# Patient Record
Sex: Female | Born: 1935 | Race: White | Hispanic: No | Marital: Married | Smoking: Never smoker
Health system: Southern US, Community
[De-identification: ages and names within clinical notes are randomized; demographics above are authoritative.]

## PROBLEM LIST (undated history)

## (undated) DIAGNOSIS — E079 Disorder of thyroid, unspecified: Secondary | ICD-10-CM

## (undated) DIAGNOSIS — K5792 Diverticulitis of intestine, part unspecified, without perforation or abscess without bleeding: Secondary | ICD-10-CM

## (undated) DIAGNOSIS — E785 Hyperlipidemia, unspecified: Secondary | ICD-10-CM

## (undated) HISTORY — DX: Disorder of thyroid, unspecified: E07.9

## (undated) HISTORY — DX: Hyperlipidemia, unspecified: E78.5

---

## 2001-07-23 ENCOUNTER — Other Ambulatory Visit: Admission: RE | Admit: 2001-07-23 | Discharge: 2001-07-23 | Payer: Self-pay | Admitting: Family Medicine

## 2003-07-20 ENCOUNTER — Ambulatory Visit (HOSPITAL_COMMUNITY): Admission: RE | Admit: 2003-07-20 | Discharge: 2003-07-20 | Payer: Self-pay | Admitting: *Deleted

## 2014-12-10 ENCOUNTER — Other Ambulatory Visit: Payer: Self-pay | Admitting: *Deleted

## 2014-12-10 DIAGNOSIS — H9190 Unspecified hearing loss, unspecified ear: Secondary | ICD-10-CM

## 2014-12-10 NOTE — Progress Notes (Signed)
Pt was discussing this with [facility] - when her husband was seen in office

## 2017-02-28 ENCOUNTER — Ambulatory Visit: Payer: Self-pay | Admitting: Pediatrics

## 2019-05-28 ENCOUNTER — Other Ambulatory Visit: Payer: Self-pay

## 2019-05-29 ENCOUNTER — Ambulatory Visit (INDEPENDENT_AMBULATORY_CARE_PROVIDER_SITE_OTHER): Payer: Medicare Other | Admitting: Family Medicine

## 2019-05-29 ENCOUNTER — Encounter: Payer: Self-pay | Admitting: Family Medicine

## 2019-05-29 VITALS — BP 141/82 | HR 84 | Temp 98.4°F | Ht 67.0 in | Wt 160.6 lb

## 2019-05-29 DIAGNOSIS — E039 Hypothyroidism, unspecified: Secondary | ICD-10-CM | POA: Insufficient documentation

## 2019-05-29 DIAGNOSIS — B360 Pityriasis versicolor: Secondary | ICD-10-CM

## 2019-05-29 DIAGNOSIS — E663 Overweight: Secondary | ICD-10-CM

## 2019-05-29 DIAGNOSIS — R7989 Other specified abnormal findings of blood chemistry: Secondary | ICD-10-CM

## 2019-05-29 NOTE — Progress Notes (Signed)
BP (!) 141/82   Pulse 84   Temp 98.4 F (36.9 C) (Temporal)   Ht '5\' 7"'  (1.702 m)   Wt 160 lb 9.6 oz (72.8 kg)   BMI 25.15 kg/m    Subjective:    Patient ID: Tiffany [name], female    DOB: 27-Jul-1934, 85 y.o.   MRN: [id number]  HPI: Tiffany [name] is a 85 y.o. female presenting on 05/29/2019 for New Patient (Initial Visit) (check up - Patient states she has not been to the doctor in years.), Establish Care, and Back Pain (Patient states it has been on and off.)   HPI Hypothyroidism recheck Patient is coming in for thyroid recheck today as well. They deny any issues with hair changes or heat or cold problems or diarrhea or constipation. They deny any chest pain or palpitations. They are currently on no medication for the thyroid but we are monitoring and the levels have been up and down  Rash Patient comes in complaining of a rash that is on the shoulders and arms and is was pigment loss.  She denies any redness or itchiness.  She says it is been there over the past few weeks and cannot tell if it spreading or not  Relevant past medical, surgical, family and social history reviewed and updated as indicated. Interim medical history since our last visit reviewed. Allergies and medications reviewed and updated.  Review of Systems  Constitutional: Negative for chills and fever.  Eyes: Negative for visual disturbance.  Respiratory: Negative for chest tightness and shortness of breath.   Cardiovascular: Negative for chest pain and leg swelling.  Musculoskeletal: Negative for back pain and gait problem.  Skin: Positive for color change and rash.  Neurological: Negative for light-headedness and headaches.  Psychiatric/Behavioral: Negative for agitation and behavioral problems.  All other systems reviewed and are negative.   Per HPI unless specifically indicated above  Social History   Socioeconomic History  . Marital status: Single    Spouse name: Not on file  . Number of  children: Not on file  . Years of education: Not on file  . Highest education level: Not on file  Occupational History  . Not on file  Social Needs  . Financial resource strain: Not on file  . Food insecurity    Worry: Not on file    Inability: Not on file  . Transportation needs    Medical: Not on file    Non-medical: Not on file  Tobacco Use  . Smoking status: Never Smoker  . Smokeless tobacco: Never Used  Substance and Sexual Activity  . Alcohol use: Never    Frequency: Never  . Drug use: Never  . Sexual activity: Not on file    Comment: married since 1975, stepchildren  Lifestyle  . Physical activity    Days per week: Not on file    Minutes per session: Not on file  . Stress: Not on file  Relationships  . Social Herbalist on phone: Not on file    Gets together: Not on file    Attends religious service: Not on file    Active member of club or organization: Not on file    Attends meetings of clubs or organizations: Not on file    Relationship status: Not on file  . Intimate partner violence    Fear of current or ex partner: Not on file    Emotionally abused: Not on file    Physically abused:  Not on file    Forced sexual activity: Not on file  Other Topics Concern  . Not on file  Social History Narrative  . Not on file    History reviewed. No pertinent surgical history.  Family History  Problem Relation Age of Onset  . Diabetes Mother   . Stroke Mother     Allergies as of 05/29/2019      Reactions   Penicillins Hives   Sulfa Antibiotics    Tetanus Toxoids Hives      Medication List       Accurate as of May 29, 2019  1:51 PM. If you have any questions, ask your nurse or doctor.        latanoprost 0.005 % ophthalmic solution Commonly known as: XALATAN 1 drop at bedtime.   OVER THE COUNTER MEDICATION Thyroid support          Objective:    BP (!) 141/82   Pulse 84   Temp 98.4 F (36.9 C) (Temporal)   Ht '5\' 7"'  (1.702 m)   Wt  160 lb 9.6 oz (72.8 kg)   BMI 25.15 kg/m   Wt Readings from Last 3 Encounters:  05/29/19 160 lb 9.6 oz (72.8 kg)    Physical Exam Vitals signs and nursing note reviewed.  Constitutional:      General: She is not in acute distress.    Appearance: She is well-developed. She is not diaphoretic.  Eyes:     Conjunctiva/sclera: Conjunctivae normal.  Cardiovascular:     Rate and Rhythm: Normal rate and regular rhythm.     Heart sounds: Normal heart sounds. No murmur.  Pulmonary:     Effort: Pulmonary effort is normal. No respiratory distress.     Breath sounds: Normal breath sounds. No wheezing.  Skin:    General: Skin is warm and dry.     Findings: Rash (Hypopigmentation and splotches on both arms and upper torso and some on her hairline) present.  Neurological:     Mental Status: She is alert and oriented to person, place, and time.     Coordination: Coordination normal.  Psychiatric:        Behavior: Behavior normal.     No results found for this or any previous visit.    Assessment & Plan:   Problem List Items Addressed This Visit      Endocrine   Hypothyroidism   Relevant Orders   CMP14+EGFR (Completed)   TSH (Completed)     Other   Overweight (BMI 25.0-29.9)   Relevant Orders   CBC with Differential/Platelet (Completed)   CMP14+EGFR (Completed)   Lipid panel (Completed)    Other Visit Diagnoses    Tinea versicolor    -  Primary   Relevant Medications   ketoconazole (NIZORAL) 2 % cream   Elevated TSH          Rash is likely tinea versicolor to we will treat for that but the possibility could be another type of hypopigmentation Follow up plan: Return in about 1 year (around 05/28/2020), or if symptoms worsen or fail to improve.  Caryl Pina, MD [facility] 05/29/2019, 1:51 PM

## 2019-05-30 ENCOUNTER — Telehealth: Payer: Self-pay | Admitting: Family Medicine

## 2019-05-30 LAB — CMP14+EGFR
ALT: 12 IU/L (ref 0–32)
AST: 15 IU/L (ref 0–40)
Albumin/Globulin Ratio: 2.3 — ABNORMAL HIGH (ref 1.2–2.2)
Albumin: 4.6 g/dL (ref 3.6–4.6)
Alkaline Phosphatase: 67 IU/L (ref 39–117)
BUN/Creatinine Ratio: 19 (ref 12–28)
BUN: 15 mg/dL (ref 8–27)
Bilirubin Total: 0.5 mg/dL (ref 0.0–1.2)
CO2: 26 mmol/L (ref 20–29)
Calcium: 10 mg/dL (ref 8.7–10.3)
Chloride: 101 mmol/L (ref 96–106)
Creatinine, Ser: 0.79 mg/dL (ref 0.57–1.00)
GFR calc Af Amer: 80 mL/min/{1.73_m2} (ref >59)
GFR calc non Af Amer: 69 mL/min/{1.73_m2} (ref >59)
Globulin, Total: 2 g/dL (ref 1.5–4.5)
Glucose: 99 mg/dL (ref 65–99)
Potassium: 4.3 mmol/L (ref 3.5–5.2)
Sodium: 143 mmol/L (ref 134–144)
Total Protein: 6.6 g/dL (ref 6.0–8.5)

## 2019-05-30 LAB — CBC WITH DIFFERENTIAL/PLATELET
Basophils Absolute: 0 10*3/uL (ref 0.0–0.2)
Basos: 1 %
EOS (ABSOLUTE): 0 10*3/uL (ref 0.0–0.4)
Eos: 1 %
Hematocrit: 41.7 % (ref 34.0–46.6)
Hemoglobin: 14.7 g/dL (ref 11.1–15.9)
Immature Grans (Abs): 0 10*3/uL (ref 0.0–0.1)
Immature Granulocytes: 0 %
Lymphocytes Absolute: 1.9 10*3/uL (ref 0.7–3.1)
Lymphs: 27 %
MCH: 31.8 pg (ref 26.6–33.0)
MCHC: 35.3 g/dL (ref 31.5–35.7)
MCV: 90 fL (ref 79–97)
Monocytes Absolute: 0.6 10*3/uL (ref 0.1–0.9)
Monocytes: 8 %
Neutrophils Absolute: 4.5 10*3/uL (ref 1.4–7.0)
Neutrophils: 63 %
Platelets: 348 10*3/uL (ref 150–450)
RBC: 4.62 x10E6/uL (ref 3.77–5.28)
RDW: 13.1 % (ref 11.7–15.4)
WBC: 7 10*3/uL (ref 3.4–10.8)

## 2019-05-30 LAB — TSH: TSH: 7.07 u[IU]/mL — ABNORMAL HIGH (ref 0.450–4.500)

## 2019-05-30 LAB — LIPID PANEL
Chol/HDL Ratio: 4.6 ratio — ABNORMAL HIGH (ref 0.0–4.4)
Cholesterol, Total: 259 mg/dL — ABNORMAL HIGH (ref 100–199)
HDL: 56 mg/dL (ref >39)
LDL Calculated: 181 mg/dL — ABNORMAL HIGH (ref 0–99)
Triglycerides: 109 mg/dL (ref 0–149)
VLDL Cholesterol Cal: 22 mg/dL (ref 5–40)

## 2019-05-30 MED ORDER — KETOCONAZOLE 2 % EX CREA: 1.0000 "application " | TOPICAL_CREAM | Freq: Every day | CUTANEOUS | 2 refills | Status: DC

## 2019-05-30 NOTE — Telephone Encounter (Signed)
Husband aware per dpr.  

## 2019-05-30 NOTE — Telephone Encounter (Signed)
Please let the patient know that I forgot to send him I sent in the cream today, have her use it every day and that will take at least a few weeks to get healed.

## 2019-06-02 MED ORDER — PRAVASTATIN SODIUM 20 MG PO TABS: 20.0000 mg | ORAL_TABLET | Freq: Every day | ORAL | 1 refills | Status: DC

## 2019-07-24 ENCOUNTER — Other Ambulatory Visit: Payer: Self-pay | Admitting: *Deleted

## 2019-07-24 DIAGNOSIS — Z20822 Contact with and (suspected) exposure to covid-19: Secondary | ICD-10-CM

## 2019-07-25 LAB — NOVEL CORONAVIRUS, NAA: SARS-CoV-2, NAA: NOT DETECTED

## 2019-09-24 ENCOUNTER — Encounter: Payer: Self-pay | Admitting: Family Medicine

## 2019-09-24 ENCOUNTER — Other Ambulatory Visit: Payer: Self-pay

## 2019-09-24 ENCOUNTER — Ambulatory Visit (INDEPENDENT_AMBULATORY_CARE_PROVIDER_SITE_OTHER): Payer: Medicare Other | Admitting: Family Medicine

## 2019-09-24 VITALS — BP 136/73 | HR 90 | Temp 97.1°F | Resp 20 | Ht 67.0 in | Wt 146.0 lb

## 2019-09-24 DIAGNOSIS — K5732 Diverticulitis of large intestine without perforation or abscess without bleeding: Secondary | ICD-10-CM | POA: Diagnosis not present

## 2019-09-24 MED ORDER — METRONIDAZOLE 500 MG PO TABS: 500.0000 mg | ORAL_TABLET | Freq: Two times a day (BID) | ORAL | 0 refills | Status: DC

## 2019-09-24 MED ORDER — CIPROFLOXACIN HCL 500 MG PO TABS: 500.0000 mg | ORAL_TABLET | Freq: Two times a day (BID) | ORAL | 0 refills | Status: DC

## 2019-09-24 NOTE — Patient Instructions (Signed)
High-Fiber Diet Fiber, also called dietary fiber, is a type of carbohydrate that is found in fruits, vegetables, whole grains, and beans. A high-fiber diet can have many health benefits. Your health care provider may recommend a high-fiber diet to help:  Prevent constipation. Fiber can make your bowel movements more regular.  Lower your cholesterol.  Relieve the following conditions: ? Swelling of veins in the anus (hemorrhoids). ? Swelling and irritation (inflammation) of specific areas of the digestive tract (uncomplicated diverticulosis). ? A problem of the large intestine (colon) that sometimes causes pain and diarrhea (irritable bowel syndrome, IBS).  Prevent overeating as part of a weight-loss plan.  Prevent heart disease, type 2 diabetes, and certain cancers. What is my plan? The recommended daily fiber intake in grams (g) includes:  38 g for men age 50 or younger.  30 g for men over age 50.  25 g for women age 50 or younger.  21 g for women over age 50. You can get the recommended daily intake of dietary fiber by:  Eating a variety of fruits, vegetables, grains, and beans.  Taking a fiber supplement, if it is not possible to get enough fiber through your diet. What do I need to know about a high-fiber diet?  It is better to get fiber through food sources rather than from fiber supplements. There is not a lot of research about how effective supplements are.  Always check the fiber content on the nutrition facts label of any prepackaged food. Look for foods that contain 5 g of fiber or more per serving.  Talk with a diet and nutrition specialist (dietitian) if you have questions about specific foods that are recommended or not recommended for your medical condition, especially if those foods are not listed below.  Gradually increase how much fiber you consume. If you increase your intake of dietary fiber too quickly, you may have bloating, cramping, or gas.  Drink plenty  of water. Water helps you to digest fiber. What are tips for following this plan?  Eat a wide variety of high-fiber foods.  Make sure that half of the grains that you eat each day are whole grains.  Eat breads and cereals that are made with whole-grain flour instead of refined flour or white flour.  Eat brown rice, bulgur wheat, or millet instead of white rice.  Start the day with a breakfast that is high in fiber, such as a cereal that contains 5 g of fiber or more per serving.  Use beans in place of meat in soups, salads, and pasta dishes.  Eat high-fiber snacks, such as berries, raw vegetables, nuts, and popcorn.  Choose whole fruits and vegetables instead of processed forms like juice or sauce. What foods can I eat?  Fruits Berries. Pears. Apples. Oranges. Avocado. Prunes and raisins. Dried figs. Vegetables Sweet potatoes. Spinach. Kale. Artichokes. Cabbage. Broccoli. Cauliflower. Green peas. Carrots. Squash. Grains Whole-grain breads. Multigrain cereal. Oats and oatmeal. Brown rice. Barley. Bulgur wheat. Millet. Quinoa. Bran muffins. Popcorn. Rye wafer crackers. Meats and other proteins Navy, kidney, and pinto beans. Soybeans. Split peas. Lentils. Nuts and seeds. Dairy Fiber-fortified yogurt. Beverages Fiber-fortified soy milk. Fiber-fortified orange juice. Other foods Fiber bars. The items listed above may not be a complete list of recommended foods and beverages. Contact a dietitian for more options. What foods are not recommended? Fruits Fruit juice. Cooked, strained fruit. Vegetables Fried potatoes. Canned vegetables. Well-cooked vegetables. Grains White bread. Pasta made with refined flour. White rice. Meats and other   proteins Fatty cuts of meat. Fried chicken or fried fish. Dairy Milk. Yogurt. Cream cheese. Sour cream. Fats and oils Butters. Beverages Soft drinks. Other foods Cakes and pastries. The items listed above may not be a complete list of foods  and beverages to avoid. Contact a dietitian for more information. Summary  Fiber is a type of carbohydrate. It is found in fruits, vegetables, whole grains, and beans.  There are many health benefits of eating a high-fiber diet, such as preventing constipation, lowering blood cholesterol, helping with weight loss, and reducing your risk of heart disease, diabetes, and certain cancers.  Gradually increase your intake of fiber. Increasing too fast can result in cramping, bloating, and gas. Drink plenty of water while you increase your fiber.  The best sources of fiber include whole fruits and vegetables, whole grains, nuts, seeds, and beans. This information is not intended to replace advice given to you by your health care provider. Make sure you discuss any questions you have with your health care provider. Document Released: 10/23/2005 Document Revised: 08/27/2017 Document Reviewed: 08/27/2017 Elsevier Patient Education  2020 [employer]. Diverticulosis  Diverticulosis is a condition that develops when small pouches (diverticula) form in the wall of the large intestine (colon). The colon is where water is absorbed and stool is formed. The pouches form when the inside layer of the colon pushes through weak spots in the outer layers of the colon. You may have a few pouches or many of them. What are the causes? The cause of this condition is not known. What increases the risk? The following factors may make you more likely to develop this condition:  Being older than age 28. Your risk for this condition increases with age. Diverticulosis is rare among people younger than age 56. By age 12, many people have it.  Eating a low-fiber diet.  Having frequent constipation.  Being overweight.  Not getting enough exercise.  Smoking.  Taking over-the-counter pain medicines, like aspirin and ibuprofen.  Having a family history of diverticulosis. What are the signs or symptoms? In most  people, there are no symptoms of this condition. If you do have symptoms, they may include:  Bloating.  Cramps in the abdomen.  Constipation or diarrhea.  Pain in the lower left side of the abdomen. How is this diagnosed? This condition is most often diagnosed during an exam for other colon problems. Because diverticulosis usually has no symptoms, it often cannot be diagnosed independently. This condition may be diagnosed by:  Using a flexible scope to examine the colon (colonoscopy).  Taking an X-ray of the colon after dye has been put into the colon (barium enema).  Doing a CT scan. How is this treated? You may not need treatment for this condition if you have never developed an infection related to diverticulosis. If you have had an infection before, treatment may include:  Eating a high-fiber diet. This may include eating more fruits, vegetables, and grains.  Taking a fiber supplement.  Taking a live bacteria supplement (probiotic).  Taking medicine to relax your colon.  Taking antibiotic medicines. Follow these instructions at home:  Drink 6-8 glasses of water or more each day to prevent constipation.  Try not to strain when you have a bowel movement.  If you have had an infection before: ? Eat more fiber as directed by your health care provider or your diet and nutrition specialist (dietitian). ? Take a fiber supplement or probiotic, if your health care provider approves.  Take over-the-counter  and prescription medicines only as told by your health care provider.  If you were prescribed an antibiotic, take it as told by your health care provider. Do not stop taking the antibiotic even if you start to feel better.  Keep all follow-up visits as told by your health care provider. This is important. Contact a health care provider if:  You have pain in your abdomen.  You have bloating.  You have cramps.  You have not had a bowel movement in 3 days. Get help right  away if:  Your pain gets worse.  Your bloating becomes very bad.  You have a fever or chills, and your symptoms suddenly get worse.  You vomit.  You have bowel movements that are bloody or black.  You have bleeding from your rectum. Summary  Diverticulosis is a condition that develops when small pouches (diverticula) form in the wall of the large intestine (colon).  You may have a few pouches or many of them.  This condition is most often diagnosed during an exam for other colon problems.  If you have had an infection related to diverticulosis, treatment may include increasing the fiber in your diet, taking supplements, or taking medicines. This information is not intended to replace advice given to you by your health care provider. Make sure you discuss any questions you have with your health care provider. Document Released: 07/20/2004 Document Revised: 10/05/2017 Document Reviewed: 09/11/2016 Elsevier Patient Education  2020 [employer].

## 2019-09-24 NOTE — Progress Notes (Signed)
Subjective:  Patient ID: Tiffany [name], female    DOB: September 05, 1934  Age: 85 y.o. MRN: [id number]  CC: Abdominal Pain (? hernia or diverticulitis)   HPI Tiffany [name] presents for bloating and pain in abd after eating tomatoes with seeds recently. No fever, no diarrhea. Moderate nausea. Has no blood in stool. HAs a history of diverticulitis. Appetite is normal .Concerned about hernia due to tightness at LUQ.Onset 2-3 days ago. Getting worse.   Depression screen [facility] 2/9 09/24/2019 05/29/2019  Decreased Interest 0 0  Down, Depressed, Hopeless 0 1  PHQ - 2 Score 0 1    History Tahesha has a past medical history of Hyperlipidemia and Thyroid disease.   She has no past surgical history on file.   Her family history includes Diabetes in her mother; Stroke in her mother.She reports that she has never smoked. She has never used smokeless tobacco. She reports that she does not drink alcohol or use drugs.    ROS Review of Systems  Constitutional: Negative.   HENT: Negative.   Eyes: Negative for visual disturbance.  Respiratory: Negative for shortness of breath.   Cardiovascular: Negative for chest pain.  Gastrointestinal: Positive for abdominal pain and nausea. Negative for blood in stool, constipation and diarrhea.  Musculoskeletal: Negative for arthralgias.    Objective:  BP 136/73   Pulse 90   Temp (!) 97.1 F (36.2 C) (Temporal)   Resp 20   Ht 5\' 7"  (1.702 m)   Wt 146 lb (66.2 kg)   SpO2 100%   BMI 22.87 kg/m   BP Readings from Last 3 Encounters:  09/24/19 136/73  05/29/19 (!) 141/82    Wt Readings from Last 3 Encounters:  09/24/19 146 lb (66.2 kg)  05/29/19 160 lb 9.6 oz (72.8 kg)     Physical Exam    Assessment & Plan:   Linzee was seen today for abdominal pain.  Diagnoses and all orders for this visit:  Diverticulitis of large intestine without bleeding, unspecified complication status -     CBC -     CMP -     Lipase  Other orders -      ciprofloxacin (CIPRO) 500 MG tablet; Take 1 tablet (500 mg total) by mouth 2 (two) times daily. -     metroNIDAZOLE (FLAGYL) 500 MG tablet; Take 1 tablet (500 mg total) by mouth 2 (two) times daily.       I have discontinued Amadi G. Greenwell's pravastatin. I am also having her start on ciprofloxacin and metroNIDAZOLE. Additionally, I am having her maintain her OVER THE COUNTER MEDICATION, latanoprost, and ketoconazole.  Allergies as of 09/24/2019      Reactions   Penicillins Hives   Sulfa Antibiotics    Tetanus Toxoids Hives      Medication List       Accurate as of September 24, 2019  4:20 PM. If you have any questions, ask your nurse or doctor.        STOP taking these medications   pravastatin 20 MG tablet Commonly known as: PRAVACHOL Stopped by: Claretta Fraise, MD     TAKE these medications   ciprofloxacin 500 MG tablet Commonly known as: Cipro Take 1 tablet (500 mg total) by mouth 2 (two) times daily. Started by: Claretta Fraise, MD   ketoconazole 2 % cream Commonly known as: NIZORAL Apply 1 application topically daily.   latanoprost 0.005 % ophthalmic solution Commonly known as: XALATAN 1 drop at bedtime.   metroNIDAZOLE  500 MG tablet Commonly known as: FLAGYL Take 1 tablet (500 mg total) by mouth 2 (two) times daily. Started by: Mechele Claude, MD   OVER THE COUNTER MEDICATION Thyroid support        Follow-up: Return if symptoms worsen or fail to improve.  Mechele Claude, M.D.

## 2019-09-25 LAB — CMP14+EGFR
ALT: 18 IU/L (ref 0–32)
AST: 18 IU/L (ref 0–40)
Albumin/Globulin Ratio: 1.8 (ref 1.2–2.2)
Albumin: 4.4 g/dL (ref 3.6–4.6)
Alkaline Phosphatase: 62 IU/L (ref 39–117)
BUN/Creatinine Ratio: 15 (ref 12–28)
BUN: 11 mg/dL (ref 8–27)
Bilirubin Total: 0.5 mg/dL (ref 0.0–1.2)
CO2: 25 mmol/L (ref 20–29)
Calcium: 10.1 mg/dL (ref 8.7–10.3)
Chloride: 100 mmol/L (ref 96–106)
Creatinine, Ser: 0.73 mg/dL (ref 0.57–1.00)
GFR calc Af Amer: 88 mL/min/{1.73_m2} (ref >59)
GFR calc non Af Amer: 76 mL/min/{1.73_m2} (ref >59)
Globulin, Total: 2.4 g/dL (ref 1.5–4.5)
Glucose: 109 mg/dL — ABNORMAL HIGH (ref 65–99)
Potassium: 4.2 mmol/L (ref 3.5–5.2)
Sodium: 140 mmol/L (ref 134–144)
Total Protein: 6.8 g/dL (ref 6.0–8.5)

## 2019-09-25 LAB — CBC WITH DIFFERENTIAL/PLATELET
Basophils Absolute: 0.1 10*3/uL (ref 0.0–0.2)
Basos: 1 %
EOS (ABSOLUTE): 0 10*3/uL (ref 0.0–0.4)
Eos: 0 %
Hematocrit: 41.6 % (ref 34.0–46.6)
Hemoglobin: 14 g/dL (ref 11.1–15.9)
Immature Grans (Abs): 0 10*3/uL (ref 0.0–0.1)
Immature Granulocytes: 0 %
Lymphocytes Absolute: 2.3 10*3/uL (ref 0.7–3.1)
Lymphs: 28 %
MCH: 30.5 pg (ref 26.6–33.0)
MCHC: 33.7 g/dL (ref 31.5–35.7)
MCV: 91 fL (ref 79–97)
Monocytes Absolute: 0.7 10*3/uL (ref 0.1–0.9)
Monocytes: 9 %
Neutrophils Absolute: 4.9 10*3/uL (ref 1.4–7.0)
Neutrophils: 62 %
Platelets: 401 10*3/uL (ref 150–450)
RBC: 4.59 x10E6/uL (ref 3.77–5.28)
RDW: 13.7 % (ref 11.7–15.4)
WBC: 8 10*3/uL (ref 3.4–10.8)

## 2019-09-25 LAB — LIPASE: Lipase: 49 U/L (ref 14–85)

## 2019-09-25 NOTE — Progress Notes (Signed)
Hello Lorenia,  Your lab result is normal and/or stable.Some minor variations that are not significant are commonly marked abnormal, but do not represent any medical problem for you.  Best regards, Claretta Fraise, M.D.

## 2019-09-26 ENCOUNTER — Telehealth: Payer: Self-pay | Admitting: Family Medicine

## 2019-09-26 NOTE — Telephone Encounter (Signed)
Patients husband called and said they were having phone issues yesterday so is unsure if someone called them for patients lab results. Wants someone to call with results when available @ cell # [phone]

## 2019-09-26 NOTE — Telephone Encounter (Signed)
Husband called and aware of labs

## 2019-09-29 ENCOUNTER — Telehealth: Payer: Self-pay | Admitting: Family Medicine

## 2019-09-29 NOTE — Telephone Encounter (Signed)
Patients husband states she has no energy has no get up and go. Patient has has extreme weakness she can not stand up. Was advised to go straight to the ER. Patient states it just hit her this morning. FYI

## 2019-09-29 NOTE — Telephone Encounter (Signed)
I agree with her going to the emergency department

## 2019-09-30 ENCOUNTER — Telehealth: Payer: Self-pay | Admitting: Family Medicine

## 2019-09-30 NOTE — Telephone Encounter (Signed)
What happens when she takes Sulf? We could stop cipro and try Bactrim. Unsure what type of allergy she has with the Bactrim (sulfa).

## 2019-09-30 NOTE — Telephone Encounter (Signed)
Left message to contact office about what type of reaction she has to sulfa

## 2019-09-30 NOTE — Telephone Encounter (Signed)
Husband aware and verbalizes understanding per dpr. °

## 2019-10-01 ENCOUNTER — Emergency Department (HOSPITAL_COMMUNITY)
Admission: EM | Admit: 2019-10-01 | Discharge: 2019-10-01 | Disposition: A | Discharge status: Home / Self Care | Payer: Medicare Other | Attending: Emergency Medicine | Admitting: Emergency Medicine

## 2019-10-01 ENCOUNTER — Emergency Department (HOSPITAL_COMMUNITY): Payer: Medicare Other

## 2019-10-01 ENCOUNTER — Encounter (HOSPITAL_COMMUNITY): Payer: Self-pay | Admitting: Emergency Medicine

## 2019-10-01 ENCOUNTER — Other Ambulatory Visit: Payer: Self-pay

## 2019-10-01 DIAGNOSIS — E039 Hypothyroidism, unspecified: Secondary | ICD-10-CM | POA: Insufficient documentation

## 2019-10-01 DIAGNOSIS — Z79899 Other long term (current) drug therapy: Secondary | ICD-10-CM | POA: Insufficient documentation

## 2019-10-01 DIAGNOSIS — R531 Weakness: Secondary | ICD-10-CM | POA: Diagnosis present

## 2019-10-01 LAB — URINALYSIS, ROUTINE W REFLEX MICROSCOPIC
Bilirubin Urine: NEGATIVE
Glucose, UA: NEGATIVE mg/dL
Hgb urine dipstick: NEGATIVE
Ketones, ur: NEGATIVE mg/dL
Leukocytes,Ua: NEGATIVE
Nitrite: NEGATIVE
Protein, ur: NEGATIVE mg/dL
Specific Gravity, Urine: 1.002 — ABNORMAL LOW (ref 1.005–1.030)
pH: 6 (ref 5.0–8.0)

## 2019-10-01 LAB — CBC WITH DIFFERENTIAL/PLATELET
Abs Immature Granulocytes: 0.02 10*3/uL (ref 0.00–0.07)
Basophils Absolute: 0.1 10*3/uL (ref 0.0–0.1)
Basophils Relative: 1 %
Eosinophils Absolute: 0.1 10*3/uL (ref 0.0–0.5)
Eosinophils Relative: 1 %
HCT: 39.1 % (ref 36.0–46.0)
Hemoglobin: 13 g/dL (ref 12.0–15.0)
Immature Granulocytes: 0 %
Lymphocytes Relative: 16 %
Lymphs Abs: 1.4 10*3/uL (ref 0.7–4.0)
MCH: 31 pg (ref 26.0–34.0)
MCHC: 33.2 g/dL (ref 30.0–36.0)
MCV: 93.3 fL (ref 80.0–100.0)
Monocytes Absolute: 0.8 10*3/uL (ref 0.1–1.0)
Monocytes Relative: 9 %
Neutro Abs: 6.3 10*3/uL (ref 1.7–7.7)
Neutrophils Relative %: 73 %
Platelets: 353 10*3/uL (ref 150–400)
RBC: 4.19 MIL/uL (ref 3.87–5.11)
RDW: 14.5 % (ref 11.5–15.5)
WBC: 8.7 10*3/uL (ref 4.0–10.5)
nRBC: 0 % (ref 0.0–0.2)

## 2019-10-01 LAB — BASIC METABOLIC PANEL
Anion gap: 10 (ref 5–15)
BUN: 13 mg/dL (ref 8–23)
CO2: 24 mmol/L (ref 22–32)
Calcium: 9.1 mg/dL (ref 8.9–10.3)
Chloride: 95 mmol/L — ABNORMAL LOW (ref 98–111)
Creatinine, Ser: 0.55 mg/dL (ref 0.44–1.00)
GFR calc Af Amer: >60 mL/min (ref >60)
GFR calc non Af Amer: >60 mL/min (ref >60)
Glucose, Bld: 101 mg/dL — ABNORMAL HIGH (ref 70–99)
Potassium: 4.1 mmol/L (ref 3.5–5.1)
Sodium: 129 mmol/L — ABNORMAL LOW (ref 135–145)

## 2019-10-01 LAB — TSH: TSH: 4.04 u[IU]/mL (ref 0.350–4.500)

## 2019-10-01 MED ORDER — SODIUM CHLORIDE 0.9 % IV BOLUS
500.0000 mL | Freq: Once | INTRAVENOUS | Status: AC
  Administered 2019-10-01: 500 mL via INTRAVENOUS

## 2019-10-01 NOTE — ED Triage Notes (Signed)
Pt was placed on cipro and flagyl for diverticulitis on 10/18. Pt states cipro made her weak in her legs, called PCP and was taken off of it.  Pt was also told to come to ER for additional blood work.

## 2019-10-01 NOTE — ED Provider Notes (Signed)
[facility] EMERGENCY DEPARTMENT Provider Note   CSN: [id number] Arrival date & time: 10/01/19  1245     History   Chief Complaint Chief Complaint  Patient presents with  . Weakness    HPI Tiffany [name] is a 85 y.o. female.     Patient is an 85 year old female with past medical history of hyperlipidemia, hypothyroidism, and recent diagnosis of diverticulitis.  She just completed a course of Cipro and Flagyl.  Her abdominal pain resolved, however she is now feeling somewhat weak and fatigued.  She was told by her primary doctor to come here for blood work to have this weakness evaluated.  Patient states that she feels "weak in the legs".  She denies chest pain or difficulty breathing.  She denies abdominal pain, nausea, vomiting, or diarrhea.  She denies any black or bloody stools.  The history is provided by the patient.  Weakness Severity:  Moderate Onset quality:  Gradual Duration:  2 days Timing:  Constant Progression:  Worsening Chronicity:  New Relieved by:  Nothing Worsened by:  Nothing Ineffective treatments:  None tried   Past Medical History:  Diagnosis Date  . Hyperlipidemia   . Thyroid disease     Patient Active Problem List   Diagnosis Date Noted  . Hypothyroidism 05/29/2019  . Overweight (BMI 25.0-29.9) 05/29/2019    History reviewed. No pertinent surgical history.   OB History   No obstetric history on file.      Home Medications    Prior to Admission medications   Medication Sig Start Date End Date Taking? Authorizing Provider  ciprofloxacin (CIPRO) 500 MG tablet Take 1 tablet (500 mg total) by mouth 2 (two) times daily. 09/24/19   Claretta Fraise, MD  ketoconazole (NIZORAL) 2 % cream Apply 1 application topically daily. 05/30/19   Dettinger, Fransisca Kaufmann, MD  latanoprost (XALATAN) 0.005 % ophthalmic solution 1 drop at bedtime.    [provider]  metroNIDAZOLE (FLAGYL) 500 MG tablet Take 1 tablet (500 mg total) by mouth 2 (two) times  daily. 09/24/19   Claretta Fraise, MD  OVER THE COUNTER MEDICATION Thyroid support    [provider]    Family History Family History  Problem Relation Age of Onset  . Diabetes Mother   . Stroke Mother     Social History Social History   Tobacco Use  . Smoking status: Never Smoker  . Smokeless tobacco: Never Used  Substance Use Topics  . Alcohol use: Never    Frequency: Never  . Drug use: Never     Allergies   Penicillins, Sulfa antibiotics, and Tetanus toxoids   Review of Systems Review of Systems  Neurological: Positive for weakness.  All other systems reviewed and are negative.    Physical Exam Updated Vital Signs BP (!) 160/67   Pulse 93   Temp 98.4 F (36.9 C)   Resp (!) 25   Wt 66.2 kg   SpO2 98%   BMI 22.87 kg/m   Physical Exam Vitals signs and nursing note reviewed.  Constitutional:      General: She is not in acute distress.    Appearance: She is well-developed. She is not diaphoretic.  HENT:     Head: Normocephalic and atraumatic.     Mouth/Throat:     Mouth: Mucous membranes are moist.     Pharynx: No oropharyngeal exudate or posterior oropharyngeal erythema.  Neck:     Musculoskeletal: Normal range of motion and neck supple.  Cardiovascular:  Rate and Rhythm: Normal rate and regular rhythm.     Heart sounds: No murmur. No friction rub. No gallop.   Pulmonary:     Effort: Pulmonary effort is normal. No respiratory distress.     Breath sounds: Normal breath sounds. No wheezing.  Abdominal:     General: Bowel sounds are normal. There is no distension.     Palpations: Abdomen is soft.     Tenderness: There is no abdominal tenderness.  Musculoskeletal: Normal range of motion.  Skin:    General: Skin is warm and dry.  Neurological:     General: No focal deficit present.     Mental Status: She is alert and oriented to person, place, and time.     Cranial Nerves: No cranial nerve deficit.     Sensory: No sensory deficit.      Motor: No weakness.     Coordination: Coordination normal.      ED Treatments / Results  Labs (all labs ordered are listed, but only abnormal results are displayed) Labs Reviewed  BASIC METABOLIC PANEL  CBC WITH DIFFERENTIAL/PLATELET  URINALYSIS, ROUTINE W REFLEX MICROSCOPIC  TSH    EKG EKG Interpretation  Date/Time:  Wednesday October 01 2019 13:00:59 EST Ventricular Rate:  99 PR Interval:    QRS Duration: 100 QT Interval:  337 QTC Calculation: 433 R Axis:   55 Text Interpretation: Sinus rhythm Low voltage, precordial leads Anteroseptal infarct, old Baseline wander in lead(s) V1 Confirmed by Geoffery Lyons ([id number]) on 10/01/2019 2:29:11 PM   Radiology No results found.  Procedures Procedures (including critical care time)  Medications Ordered in ED Medications  sodium chloride 0.9 % bolus 500 mL (has no administration in time range)     Initial Impression / Assessment and Plan / ED Course  I have reviewed the triage vital signs and the nursing notes.  Pertinent labs & imaging results that were available during my care of the patient were reviewed by me and considered in my medical decision making (see chart for details).  Patient presenting here with complaints of weakness.  She was recently treated for diverticulitis with Cipro and Flagyl.  Patient's work-up today reveals a normal CBC and basic metabolic panel.  EKG is unremarkable and chest x-ray shows no acute abnormality.  I am uncertain as to the etiology of the patient's weakness, however nothing appears emergent.  I feel as though the patient can safely be discharged with as needed return.  Final Clinical Impressions(s) / ED Diagnoses   Final diagnoses:  None    ED Discharge Orders    None       Geoffery Lyons, MD 10/01/19 1517

## 2019-10-01 NOTE — Discharge Instructions (Addendum)
Continue medications as previously prescribed.  Follow-up with your primary doctor in the next few days, and return to the ER in the meantime if symptoms significantly worsen or change.

## 2019-10-01 NOTE — ED Notes (Signed)
Patient with no complaints at this time. Respirations even and unlabored. Skin warm/dry. Discharge instructions reviewed with patient at this time. Patient given opportunity to voice concerns/ask questions. IV removed per policy and band-aid applied to site. Patient discharged at this time and left Emergency Department via wheelchair.  

## 2019-10-05 ENCOUNTER — Observation Stay (HOSPITAL_COMMUNITY)
Admission: EM | Admit: 2019-10-05 | Discharge: 2019-10-06 | Disposition: A | Discharge status: Home / Self Care | Payer: Medicare Other | Attending: Internal Medicine | Admitting: Internal Medicine

## 2019-10-05 ENCOUNTER — Encounter (HOSPITAL_COMMUNITY): Payer: Self-pay | Admitting: Emergency Medicine

## 2019-10-05 ENCOUNTER — Other Ambulatory Visit: Payer: Self-pay

## 2019-10-05 DIAGNOSIS — E860 Dehydration: Principal | ICD-10-CM | POA: Insufficient documentation

## 2019-10-05 DIAGNOSIS — R531 Weakness: Secondary | ICD-10-CM

## 2019-10-05 DIAGNOSIS — H409 Unspecified glaucoma: Secondary | ICD-10-CM

## 2019-10-05 DIAGNOSIS — Z79899 Other long term (current) drug therapy: Secondary | ICD-10-CM | POA: Diagnosis not present

## 2019-10-05 DIAGNOSIS — Z20828 Contact with and (suspected) exposure to other viral communicable diseases: Secondary | ICD-10-CM | POA: Diagnosis not present

## 2019-10-05 DIAGNOSIS — E871 Hypo-osmolality and hyponatremia: Secondary | ICD-10-CM | POA: Insufficient documentation

## 2019-10-05 DIAGNOSIS — E039 Hypothyroidism, unspecified: Secondary | ICD-10-CM | POA: Diagnosis not present

## 2019-10-05 DIAGNOSIS — K5792 Diverticulitis of intestine, part unspecified, without perforation or abscess without bleeding: Secondary | ICD-10-CM | POA: Insufficient documentation

## 2019-10-05 HISTORY — DX: Diverticulitis of intestine, part unspecified, without perforation or abscess without bleeding: K57.92

## 2019-10-05 LAB — URINALYSIS, ROUTINE W REFLEX MICROSCOPIC
Bilirubin Urine: NEGATIVE
Glucose, UA: NEGATIVE mg/dL
Hgb urine dipstick: NEGATIVE
Ketones, ur: NEGATIVE mg/dL
Leukocytes,Ua: NEGATIVE
Nitrite: NEGATIVE
Protein, ur: NEGATIVE mg/dL
Specific Gravity, Urine: 1.001 — ABNORMAL LOW (ref 1.005–1.030)
pH: 5 (ref 5.0–8.0)

## 2019-10-05 LAB — BASIC METABOLIC PANEL
Anion gap: 10 (ref 5–15)
BUN: 12 mg/dL (ref 8–23)
CO2: 20 mmol/L — ABNORMAL LOW (ref 22–32)
Calcium: 8.6 mg/dL — ABNORMAL LOW (ref 8.9–10.3)
Chloride: 95 mmol/L — ABNORMAL LOW (ref 98–111)
Creatinine, Ser: 0.49 mg/dL (ref 0.44–1.00)
GFR calc Af Amer: >60 mL/min (ref >60)
GFR calc non Af Amer: >60 mL/min (ref >60)
Glucose, Bld: 112 mg/dL — ABNORMAL HIGH (ref 70–99)
Potassium: 4.4 mmol/L (ref 3.5–5.1)
Sodium: 125 mmol/L — ABNORMAL LOW (ref 135–145)

## 2019-10-05 LAB — CBC WITH DIFFERENTIAL/PLATELET
Abs Immature Granulocytes: 0.02 10*3/uL (ref 0.00–0.07)
Basophils Absolute: 0 10*3/uL (ref 0.0–0.1)
Basophils Relative: 0 %
Eosinophils Absolute: 0 10*3/uL (ref 0.0–0.5)
Eosinophils Relative: 0 %
HCT: 39.7 % (ref 36.0–46.0)
Hemoglobin: 13.4 g/dL (ref 12.0–15.0)
Immature Granulocytes: 0 %
Lymphocytes Relative: 14 %
Lymphs Abs: 1.1 10*3/uL (ref 0.7–4.0)
MCH: 31 pg (ref 26.0–34.0)
MCHC: 33.8 g/dL (ref 30.0–36.0)
MCV: 91.9 fL (ref 80.0–100.0)
Monocytes Absolute: 0.9 10*3/uL (ref 0.1–1.0)
Monocytes Relative: 11 %
Neutro Abs: 5.9 10*3/uL (ref 1.7–7.7)
Neutrophils Relative %: 75 %
Platelets: 362 10*3/uL (ref 150–400)
RBC: 4.32 MIL/uL (ref 3.87–5.11)
RDW: 14.2 % (ref 11.5–15.5)
WBC: 8 10*3/uL (ref 4.0–10.5)
nRBC: 0 % (ref 0.0–0.2)

## 2019-10-05 LAB — COMPREHENSIVE METABOLIC PANEL
ALT: 17 U/L (ref 0–44)
AST: 22 U/L (ref 15–41)
Albumin: 3.8 g/dL (ref 3.5–5.0)
Alkaline Phosphatase: 62 U/L (ref 38–126)
Anion gap: 13 (ref 5–15)
BUN: 12 mg/dL (ref 8–23)
CO2: 19 mmol/L — ABNORMAL LOW (ref 22–32)
Calcium: 8.6 mg/dL — ABNORMAL LOW (ref 8.9–10.3)
Chloride: 90 mmol/L — ABNORMAL LOW (ref 98–111)
Creatinine, Ser: 0.48 mg/dL (ref 0.44–1.00)
GFR calc Af Amer: >60 mL/min (ref >60)
GFR calc non Af Amer: >60 mL/min (ref >60)
Glucose, Bld: 117 mg/dL — ABNORMAL HIGH (ref 70–99)
Potassium: 4 mmol/L (ref 3.5–5.1)
Sodium: 122 mmol/L — ABNORMAL LOW (ref 135–145)
Total Bilirubin: 0.3 mg/dL (ref 0.3–1.2)
Total Protein: 6.6 g/dL (ref 6.5–8.1)

## 2019-10-05 LAB — CK: Total CK: 124 U/L (ref 38–234)

## 2019-10-05 LAB — SARS CORONAVIRUS 2 (TAT 6-24 HRS): SARS Coronavirus 2: NEGATIVE

## 2019-10-05 LAB — TROPONIN I (HIGH SENSITIVITY)
Troponin I (High Sensitivity): 6 ng/L (ref <18)
Troponin I (High Sensitivity): 8 ng/L (ref <18)

## 2019-10-05 LAB — TSH: TSH: 4.031 u[IU]/mL (ref 0.350–4.500)

## 2019-10-05 LAB — OSMOLALITY: Osmolality: 260 mOsm/kg — ABNORMAL LOW (ref 275–295)

## 2019-10-05 LAB — CORTISOL: Cortisol, Plasma: 13.6 ug/dL

## 2019-10-05 MED ORDER — SODIUM CHLORIDE 0.9% FLUSH: 3.0000 mL | INTRAVENOUS | Status: DC | PRN

## 2019-10-05 MED ORDER — LATANOPROST 0.005 % OP SOLN
1.0000 [drp] | Freq: Every day | OPHTHALMIC | Status: DC
  Administered 2019-10-05: 1 [drp] via OPHTHALMIC
  Filled 2019-10-05 (×2): qty 2.5

## 2019-10-05 MED ORDER — ONDANSETRON HCL 4 MG PO TABS: 4.0000 mg | ORAL_TABLET | Freq: Four times a day (QID) | ORAL | Status: DC | PRN

## 2019-10-05 MED ORDER — ACETAMINOPHEN 325 MG PO TABS: 650.0000 mg | ORAL_TABLET | Freq: Four times a day (QID) | ORAL | Status: DC | PRN

## 2019-10-05 MED ORDER — SODIUM CHLORIDE 0.9% FLUSH
3.0000 mL | Freq: Two times a day (BID) | INTRAVENOUS | Status: DC
  Administered 2019-10-05 – 2019-10-06 (×2): 3 mL via INTRAVENOUS

## 2019-10-05 MED ORDER — ENOXAPARIN SODIUM 40 MG/0.4ML ~~LOC~~ SOLN
40.0000 mg | SUBCUTANEOUS | Status: DC
  Administered 2019-10-05: 17:00:00 40 mg via SUBCUTANEOUS
  Filled 2019-10-05 (×2): qty 0.4

## 2019-10-05 MED ORDER — SODIUM CHLORIDE 0.9 % IV BOLUS
1000.0000 mL | Freq: Once | INTRAVENOUS | Status: AC
  Administered 2019-10-05: 09:00:00 1000 mL via INTRAVENOUS

## 2019-10-05 MED ORDER — ONDANSETRON HCL 4 MG/2ML IJ SOLN: 4.0000 mg | Freq: Four times a day (QID) | INTRAMUSCULAR | Status: DC | PRN

## 2019-10-05 MED ORDER — SODIUM CHLORIDE 0.9 % IV SOLN: 250.0000 mL | INTRAVENOUS | Status: DC | PRN

## 2019-10-05 MED ORDER — VITAMIN B-12 100 MCG PO TABS
100.0000 ug | ORAL_TABLET | Freq: Every day | ORAL | Status: DC
  Administered 2019-10-05 – 2019-10-06 (×2): 100 ug via ORAL
  Filled 2019-10-05 (×2): qty 1

## 2019-10-05 MED ORDER — SODIUM CHLORIDE 1 G PO TABS
1.0000 g | ORAL_TABLET | Freq: Two times a day (BID) | ORAL | Status: DC
  Administered 2019-10-05 – 2019-10-06 (×2): 1 g via ORAL
  Filled 2019-10-05 (×2): qty 1

## 2019-10-05 MED ORDER — ACETAMINOPHEN 650 MG RE SUPP: 650.0000 mg | Freq: Four times a day (QID) | RECTAL | Status: DC | PRN

## 2019-10-05 NOTE — H&P (Signed)
History and Physical    Tiffany [name] TDS:[id number] DOB: 10-26-1934 DOA: 10/05/2019  Referring MD/NP/PA: Dr. Particia Nearing PCP: Dettinger, Elige Radon, MD  Patient coming from: Home  Chief Complaint: Generalized weakness, hyponatremia  HPI: Tiffany [name] is a 85 y.o. female with past medical history of diverticulosis hyperlipidemia, hypothyroidism and and glaucoma; who recently completed treatment for diverticulitis flare as an outpatient and has experienced since then ongoing weakness.  Patient reports in the last week ability to maintain adequate hydration and nutrition; in fact expressed to be drinking significant amount of water as instructed by her provider (patient expressed drinking up to 8 bottles of 16 ounces 1 day); she is present feeling significantly weak and easily fatigued.  No fever, no chills, no nausea, no vomiting, no abdominal pain, no dysuria, no hematuria, no melena, no hematochezia, no diarrhea currently, no headaches, no focal deficits or any other complaints.  In the ED work-up demonstrated a sodium level of 119; 1 L of normal saline bolus was provided as a trial management for hyponatremia; TRH consulted to place patient in the hospital for further evaluation and management.  Past Medical/Surgical History: Past Medical History:  Diagnosis Date  . Diverticulitis   . Hyperlipidemia   . Thyroid disease     Past Surgical History:  Procedure Laterality Date  . CATARACT EXTRACTION      Social History:  reports that she has never smoked. She has never used smokeless tobacco. She reports that she does not drink alcohol or use drugs.  Allergies: Allergies  Allergen Reactions  . Penicillins Hives    Did it involve swelling of the face/tongue/throat, SOB, or low BP? No Did it involve sudden or severe rash/hives, skin peeling, or any reaction on the inside of your mouth or nose? No Did you need to seek medical attention at a hospital or doctor's office? No When did it  last happen? If all above answers are "NO", may proceed with cephalosporin use.    . Sulfa Antibiotics   . Tetanus Toxoids Hives    Family History:  Family History  Problem Relation Age of Onset  . Diabetes Mother   . Stroke Mother     Prior to Admission medications   Medication Sig Start Date End Date Taking? Authorizing Provider  latanoprost (XALATAN) 0.005 % ophthalmic solution 1 drop at bedtime.   Yes [provider]  MAGNESIUM PO Take 600 mg by mouth daily.   Yes [provider]  OVER THE COUNTER MEDICATION Thyroid support   Yes [provider]  vitamin B-12 (CYANOCOBALAMIN) 100 MCG tablet Take 100 mcg by mouth daily.   Yes [provider]    Review of Systems:  Negative except as otherwise mentioned in HPI.  Physical Exam: Vitals:   10/05/19 0945 10/05/19 1000 10/05/19 1015 10/05/19 1146  BP:  (!) 142/66  (!) 156/67  Pulse: 83 83 81 83  Resp: 18 16 18 18   Temp:    98.2 F (36.8 C)  TempSrc:    Oral  SpO2: 98% 99% 99% 100%  Weight:      Height:    5\' 7"  (1.702 m)    Constitutional: NAD, calm, comfortable; chest pain, no nausea, no vomiting, no abdominal pain, no fever.  Reports feeling weak. Eyes: PERRL, lids and conjunctivae normal ENMT: Mucous membranes are moist. Posterior pharynx clear of any exudate or lesions. Neck: normal, supple, no masses, no thyromegaly Respiratory: clear to auscultation bilaterally, no wheezing, no crackles. Normal respiratory effort.  No accessory muscle use.  Cardiovascular: Regular rate and rhythm, no murmurs / rubs / gallops. No extremity edema. 2+ pedal pulses. No carotid bruits.  Abdomen: no tenderness, no masses palpated. No hepatosplenomegaly. Bowel sounds positive.  Musculoskeletal: no clubbing / cyanosis. No joint deformity upper and lower extremities. Good ROM, no contractures. Normal muscle tone.  Skin: no rashes, lesions, ulcers. No induration Neurologic: CN 2-12 grossly intact.  Sensation intact, DTR normal. Strength 5/5 in all 4.  Psychiatric: Normal judgment and insight. Alert and oriented x 3. Normal mood.    Labs on Admission: I have personally reviewed the following labs and imaging studies  CBC: Recent Labs  Lab 10/01/19 1349 10/05/19 0836  WBC 8.7 8.0  NEUTROABS 6.3 5.9  HGB 13.0 13.4  HCT 39.1 39.7  MCV 93.3 91.9  PLT 353 347   Basic Metabolic Panel: Recent Labs  Lab 10/01/19 1349 10/05/19 0836  NA 129* 122*  K 4.1 4.0  CL 95* 90*  CO2 24 19*  GLUCOSE 101* 117*  BUN 13 12  CREATININE 0.55 0.48  CALCIUM 9.1 8.6*   GFR: Estimated Creatinine Clearance: 51.8 mL/min (by C-G formula based on SCr of 0.48 mg/dL). Liver Function Tests: Recent Labs  Lab 10/05/19 0836  AST 22  ALT 17  ALKPHOS 62  BILITOT 0.3  PROT 6.6  ALBUMIN 3.8   Cardiac Enzymes: Recent Labs  Lab 10/05/19 0836  CKTOTAL 124   Thyroid Function Tests: Recent Labs    10/05/19 0838  TSH 4.031   Urine analysis:    Component Value Date/Time   COLORURINE COLORLESS (A) 10/05/2019 0931   APPEARANCEUR CLEAR 10/05/2019 0931   LABSPEC 1.001 (L) 10/05/2019 0931   PHURINE 5.0 10/05/2019 [place] 10/05/2019 [place] 10/05/2019 [place] 10/05/2019 [place] 10/05/2019 0931   PROTEINUR NEGATIVE 10/05/2019 0931   NITRITE NEGATIVE 10/05/2019 0931   LEUKOCYTESUR NEGATIVE 10/05/2019 0931    Recent Results (from the past 240 hour(s))  SARS CORONAVIRUS 2 (TAT 6-24 HRS) Nasopharyngeal Nasopharyngeal Swab     Status: None   Collection Time: 10/05/19  8:08 AM   Specimen: Nasopharyngeal Swab  Result Value Ref Range Status   SARS Coronavirus 2 NEGATIVE NEGATIVE Final    Comment: (NOTE) SARS-CoV-2 target nucleic acids are NOT DETECTED. The SARS-CoV-2 RNA is generally detectable in upper and lower respiratory specimens during the acute phase of infection. Negative results do not preclude SARS-CoV-2 infection,  do not rule out co-infections with other pathogens, and should not be used as the sole basis for treatment or other patient management decisions. Negative results must be combined with clinical observations, patient history, and epidemiological information. The expected result is Negative. Fact Sheet for Patients: SugarRoll.be Fact Sheet for Healthcare Providers: https://www.woods-mathews.com/ This test is not yet approved or cleared by the [place] FDA and  has been authorized for detection and/or diagnosis of SARS-CoV-2 by FDA under an Emergency Use Authorization (EUA). This EUA will remain  in effect (meaning this test can be used) for the duration of the COVID-19 declaration under Section 56 4(b)(1) of the Act, 21 U.S.C. section 360bbb-3(b)(1), unless the authorization is terminated or revoked sooner. Performed at [facility], [place] [address], [place], [place] [address]      Radiological Exams on Admission: No results found.  EKG: Independently reviewed.  Normal sinus rhythm; no acute ischemic changes.  Assessment/Plan 1-dehydration with hyponatremia -No seizure or encephalopathy appreciated -  Sodium level 119 -Patient denies nausea or vomiting currently -1 L IV fluid given in the ED -Reports prior to admission drinking too much free water -Checking TSH, urine and serum osmolality, cortisol level and random urine sodium. 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 -Urinalysis demonstrating low specific gravity -Covid test negative.  2-Hypothyroidism -Currently no using any medications -Last TSH within normal limits -Repeat TSH  3-Weakness -Associated with hyponatremia -Once medically  stable will assess with physical therapy; but anticipate ability to return back home with family care and possible home health services.  4-recent diverticulitis flare -Patient denies nausea, vomiting and abdominal pain -She completed treatment with Cipro and Flagyl as an outpatient. -Continue to monitor.  5-history of glaucoma -Continue latanoprost eyedrops  DVT prophylaxis: Lovenox Code Status: Full code Family Communication: Sister at bedside. Disposition Plan: Anticipate discharge home once electrolytes stabilized. Consults called: None Admission status: Observation, telemetry bed, length of stay less than 2 midnights.   Time Spent: 65 minutes.  Vassie Lollarlos Sai Zinn MD [facility] Pager [phone]   10/05/2019, 5:44 PM

## 2019-10-05 NOTE — ED Notes (Signed)
Date and time results received: 10/05/19 0930 (use smartphrase ".now" to insert current time)  Test: na+ Critical Value: 119  Name of Provider Notified: haviland  Orders Received? Or Actions Taken?: see chart

## 2019-10-05 NOTE — ED Triage Notes (Signed)
Pt states she was here Wednesday for "reaction to Cipro" which was weakness in her legs. Was taken off of it (diverticulitis) and not started on anything else. Has been having some diarrhea and feels very weak.

## 2019-10-05 NOTE — ED Provider Notes (Signed)
[facility] EMERGENCY DEPARTMENT Provider Note   CSN: [id number] Arrival date & time: 10/05/19  0747     History   Chief Complaint Chief Complaint  Patient presents with  . Weakness    HPI Tiffany [name] is a 85 y.o. female.     Pt presents to the ED with weakness.  Pt said it started after she was put on Cipro for diverticulitis on 11/18.  She took 11 of the 14 pills.  She has weakness in both legs.  No numbness.  She feels like her legs are going to give out.  They are both affected equally.  She said it goes away after a few hrs, but it keeps coming back.  The pt came to the ED on the 25th for the same.  The pt's work up was negative for anything.  She said it has continued.  The pt did have 1 episode of diarrhea this morning, but no more abdominal pain.  She has been able to eat and to drink. She has been drinking a lot of water to stay hydrated. She denies f/c.  No known covid exposures.      Past Medical History:  Diagnosis Date  . Diverticulitis   . Hyperlipidemia   . Thyroid disease     Patient Active Problem List   Diagnosis Date Noted  . Dehydration with hyponatremia 10/05/2019  . Hypothyroidism 05/29/2019  . Overweight (BMI 25.0-29.9) 05/29/2019    Past Surgical History:  Procedure Laterality Date  . CATARACT EXTRACTION       OB History   No obstetric history on file.      Home Medications    Prior to Admission medications   Medication Sig Start Date End Date Taking? Authorizing Provider  latanoprost (XALATAN) 0.005 % ophthalmic solution 1 drop at bedtime.   Yes [provider]  MAGNESIUM PO Take 600 mg by mouth daily.   Yes [provider]  OVER THE COUNTER MEDICATION Thyroid support   Yes [provider]  vitamin B-12 (CYANOCOBALAMIN) 100 MCG tablet Take 100 mcg by mouth daily.   Yes [provider]  ciprofloxacin (CIPRO) 500 MG tablet Take 1 tablet (500 mg total) by mouth 2 (two) times daily. Patient not  taking: Reported on 10/05/2019 09/24/19   Mechele Claude, MD  ketoconazole (NIZORAL) 2 % cream Apply 1 application topically daily. Patient not taking: Reported on 10/05/2019 05/30/19   Dettinger, Elige Radon, MD  metroNIDAZOLE (FLAGYL) 500 MG tablet Take 1 tablet (500 mg total) by mouth 2 (two) times daily. Patient not taking: Reported on 10/05/2019 09/24/19   Mechele Claude, MD    Family History Family History  Problem Relation Age of Onset  . Diabetes Mother   . Stroke Mother     Social History Social History   Tobacco Use  . Smoking status: Never Smoker  . Smokeless tobacco: Never Used  Substance Use Topics  . Alcohol use: Never    Frequency: Never  . Drug use: Never     Allergies   Penicillins, Sulfa antibiotics, and Tetanus toxoids   Review of Systems Review of Systems  Neurological: Positive for weakness.     Physical Exam Updated Vital Signs BP (!) 142/66   Pulse 81   Temp 97.9 F (36.6 C) (Oral)   Resp 18   Ht 5\' 7"  (1.702 m)   Wt 66.2 kg   SpO2 99%   BMI 22.87 kg/m   Physical Exam Vitals signs and nursing note  reviewed.  Constitutional:      Appearance: Normal appearance.  HENT:     Head: Normocephalic and atraumatic.     Right Ear: External ear normal.     Left Ear: External ear normal.     Nose: Nose normal.     Mouth/Throat:     Mouth: Mucous membranes are dry.  Eyes:     Extraocular Movements: Extraocular movements intact.     Conjunctiva/sclera: Conjunctivae normal.     Pupils: Pupils are equal, round, and reactive to light.  Neck:     Musculoskeletal: Normal range of motion and neck supple.  Cardiovascular:     Rate and Rhythm: Normal rate and regular rhythm.     Pulses: Normal pulses.     Heart sounds: Normal heart sounds.  Pulmonary:     Effort: Pulmonary effort is normal.     Breath sounds: Normal breath sounds.  Abdominal:     General: Abdomen is flat. Bowel sounds are normal.     Palpations: Abdomen is soft.   Musculoskeletal: Normal range of motion.  Skin:    General: Skin is warm.     Capillary Refill: Capillary refill takes less than 2 seconds.  Neurological:     General: No focal deficit present.     Mental Status: She is alert and oriented to person, place, and time.  Psychiatric:        Mood and Affect: Mood normal.        Behavior: Behavior normal.        Thought Content: Thought content normal.        Judgment: Judgment normal.      ED Treatments / Results  Labs (all labs ordered are listed, but only abnormal results are displayed) Labs Reviewed  COMPREHENSIVE METABOLIC PANEL - Abnormal; Notable for the following components:      Result Value   Sodium 119 (*)    Chloride 88 (*)    Glucose, Bld 120 (*)    Calcium 8.5 (*)    Total Protein 6.4 (*)    All other components within normal limits  URINALYSIS, ROUTINE W REFLEX MICROSCOPIC - Abnormal; Notable for the following components:   Color, Urine COLORLESS (*)    Specific Gravity, Urine 1.001 (*)    All other components within normal limits  SARS CORONAVIRUS 2 (TAT 6-24 HRS)  CBC WITH DIFFERENTIAL/PLATELET  CK  TSH  CREATININE, URINE, RANDOM  CORTISOL  OSMOLALITY  TROPONIN I (HIGH SENSITIVITY)  TROPONIN I (HIGH SENSITIVITY)    EKG EKG Interpretation  Date/Time:  Sunday October 05 2019 08:11:11 EST Ventricular Rate:  84 PR Interval:    QRS Duration: 94 QT Interval:  360 QTC Calculation: 426 R Axis:   55 Text Interpretation: Sinus rhythm Atrial premature complex Low voltage, precordial leads No significant change since last tracing Confirmed by Jacalyn LefevreHaviland, Kaushik Maul [phone]([id number]) on 10/05/2019 8:19:04 AM   Radiology No results found.  Procedures Procedures (including critical care time)  Medications Ordered in ED Medications  enoxaparin (LOVENOX) injection 40 mg (has no administration in time range)  sodium chloride 0.9 % bolus 1,000 mL (0 mLs Intravenous Stopped 10/05/19 0931)     Initial Impression /  Assessment and Plan / ED Course  I have reviewed the triage vital signs and the nursing notes.  Pertinent labs & imaging results that were available during my care of the patient were reviewed by me and considered in my medical decision making (see chart for details).  Pt is significantly hyponatremic.  It is likely dilutional from excess water intake.  Sodium has gone from 140 on 11/18 to 126 on the 25th and is 119 today.  Pt given 1L NS bolus.  CXR from 11/25 was clear and she has no resp complaints.  Covid swab is pending. The pt was d/w Dr. Dyann Kief ([facility]) for admission.    Final Clinical Impressions(s) / ED Diagnoses   Final diagnoses:  Weakness  Hyponatremia    ED Discharge Orders    None       Isla Pence, MD 10/05/19 1027

## 2019-10-06 DIAGNOSIS — E871 Hypo-osmolality and hyponatremia: Secondary | ICD-10-CM | POA: Diagnosis not present

## 2019-10-06 DIAGNOSIS — E039 Hypothyroidism, unspecified: Secondary | ICD-10-CM | POA: Diagnosis not present

## 2019-10-06 DIAGNOSIS — R531 Weakness: Secondary | ICD-10-CM | POA: Diagnosis not present

## 2019-10-06 DIAGNOSIS — E860 Dehydration: Secondary | ICD-10-CM | POA: Diagnosis not present

## 2019-10-06 LAB — CREATININE, URINE, RANDOM: Creatinine, Urine: 6.11 mg/dL

## 2019-10-06 LAB — OSMOLALITY, URINE: Osmolality, Ur: 77 mOsm/kg — ABNORMAL LOW (ref 300–900)

## 2019-10-06 LAB — BASIC METABOLIC PANEL
Anion gap: 8 (ref 5–15)
BUN: 11 mg/dL (ref 8–23)
CO2: 24 mmol/L (ref 22–32)
Calcium: 9 mg/dL (ref 8.9–10.3)
Chloride: 102 mmol/L (ref 98–111)
Creatinine, Ser: 0.61 mg/dL (ref 0.44–1.00)
GFR calc Af Amer: >60 mL/min (ref >60)
GFR calc non Af Amer: >60 mL/min (ref >60)
Glucose, Bld: 106 mg/dL — ABNORMAL HIGH (ref 70–99)
Potassium: 4 mmol/L (ref 3.5–5.1)
Sodium: 134 mmol/L — ABNORMAL LOW (ref 135–145)

## 2019-10-06 MED ORDER — MAGNESIUM 400 MG PO TABS: 600.0000 mg | ORAL_TABLET | Freq: Every day | ORAL | 1 refills | Status: DC

## 2019-10-06 NOTE — TOC Transition Note (Signed)
Transition of Care [facility]) - CM/SW Discharge Note   Patient Details  Name: Tiffany [name] MRN: [id number] Date of Birth: 08-05-1934  [facility] CM/SW Contact:  Shade Flood, LCSW Phone Number: 10/06/2019, 10:54 AM   Clinical Narrative:     Pt was here less than 24 hours observation status. PT recommending [place] [facility]. Spoke with pt to offer choice. Pt states her husband is currently receiving HH from [facility] and she would like a referral there. [facility] to [facility]. There are no other TOC needs for dc.   Final next level of care: Home w Home Health Services Barriers to Discharge: Barriers Resolved   Patient Goals and CMS Choice   CMS Medicare.gov Compare Post Acute Care list provided to:: Patient Choice offered to / list presented to : Patient  Discharge Placement                       Discharge Plan and Services                          HH Arranged: PT [facility] Agency: [place] Date [place]: 10/06/19   Representative spoke with at [place]: Maebelle Munroe  Social Determinants of Health ([place]) Interventions     Readmission Risk Interventions No flowsheet data found.

## 2019-10-06 NOTE — Progress Notes (Signed)
Nsg Discharge Note  Admit Date:  10/05/2019 Discharge date: 10/06/2019   Tiffany [name] to be D/[name]'d Home  per MD order.  AVS completed.  Patient able to verbalize understanding.  Discharge Medication: Allergies as of 10/06/2019      Reactions   Penicillins Hives   Did it involve swelling of the face/tongue/throat, SOB, or low BP? No Did it involve sudden or severe rash/hives, skin peeling, or any reaction on the inside of your mouth or nose? No Did you need to seek medical attention at a hospital or doctor's office? No When did it last happen? If all above answers are "NO", may proceed with cephalosporin use.   Sulfa Antibiotics    Tetanus Toxoids Hives      Medication List    TAKE these medications   latanoprost 0.005 % ophthalmic solution Commonly known as: XALATAN 1 drop at bedtime.   Magnesium 400 MG Tabs Take 600 mg by mouth daily. What changed: medication strength   OVER THE COUNTER MEDICATION Thyroid support   vitamin B-12 100 MCG tablet Commonly known as: CYANOCOBALAMIN Take 100 mcg by mouth daily.       Discharge Assessment: Vitals:   10/06/19 0442 10/06/19 0815  BP: (!) 147/72 (!) 125/55  Pulse: 78 85  Resp: 17   Temp: 98.3 F [name]   SpO2: 100% 98%   Skin clean, dry and intact without evidence of skin break down, no evidence of skin tears noted. IV catheter discontinued intact. Site without signs and symptoms of complications - no redness or edema noted at insertion site, patient denies [name]/o pain - only slight tenderness at site.  Dressing with slight pressure applied.  D/[name] Instructions-Education: Discharge instructions given to patient with verbalized understanding. D/[name] education completed with patient including follow up instructions, medication list, d/[name] activities limitations if indicated, with other d/[name] instructions as indicated by MD - patient able to verbalize understanding, all questions fully answered. Patient instructed to return to  ED, call 911, or call MD for any changes in condition.  Patient escorted via [place], and D/[name] home via private auto.  Tiffany Winokur C, RN 10/06/2019 11:20 AM

## 2019-10-06 NOTE — Evaluation (Signed)
Physical Therapy Evaluation Patient Details Name: Tiffany [name] MRN: [id number] DOB: Jan 26, 1934 Today's Date: 10/06/2019   History of Present Illness  Tiffany [name] is a 85 y.o. female with past medical history of diverticulosis hyperlipidemia, hypothyroidism and and glaucoma; who recently completed treatment for diverticulitis flare as an outpatient and has experienced since then ongoing weakness.  Patient reports in the last week ability to maintain adequate hydration and nutrition; in fact expressed to be drinking significant amount of water as instructed by her provider (patient expressed drinking up to 8 bottles of 16 ounces 1 day); she is present feeling significantly weak and easily fatigued.  No fever, no chills, no nausea, no vomiting, no abdominal pain, no dysuria, no hematuria, no melena, no hematochezia, no diarrhea currently, no headaches, no focal deficits or any other complaints.    Clinical Impression  Patient limited for functional mobility as stated below secondary to BLE weakness, fatigue and fair/poor standing balance.  Patient very slow and unsteady without AD or using SPC, required use of RW for safety and required frequent standing rest breaks during ambulation due to c/o fatigue.  Patient tolerated sitting up in chair after therap - RN notified. Patient will benefit from continued physical therapy in hospital and recommended venue below to increase strength, balance, endurance for safe ADLs and gait.     Follow Up Recommendations Home health PT;Supervision - Intermittent;Supervision for mobility/OOB    Equipment Recommendations  None recommended by PT    Recommendations for Other Services       Precautions / Restrictions Precautions Precautions: Fall Restrictions Weight Bearing Restrictions: No      Mobility  Bed Mobility Overal bed mobility: Modified Independent             General bed mobility comments: increased time  Transfers Overall transfer  level: Needs assistance Equipment used: Rolling walker (2 wheeled) Transfers: Sit to/from [facility] Sit to Stand: Min guard Stand pivot transfers: Min guard       General transfer comment: increased time, labored movement  Ambulation/Gait Ambulation/Gait assistance: Min guard Gait Distance (Feet): 60 Feet Assistive device: Rolling walker (2 wheeled) Gait Pattern/deviations: Step-through pattern;Decreased step length - right;Decreased stride length Gait velocity: decreased   General Gait Details: slow slightly labored cadence with frequent standing rest breaks due to c/o BLE weakness, no loss of balance, limited secondary to c/o fatigue  Stairs            Wheelchair Mobility    Modified Rankin (Stroke Patients Only)       Balance Overall balance assessment: Needs assistance Sitting-balance support: Feet supported;No upper extremity supported Sitting balance-Leahy Scale: Good Sitting balance - Comments: seated at bedside                                     Pertinent Vitals/Pain Pain Assessment: No/denies pain    Home Living Family/patient expects to be discharged to:: Private residence Living Arrangements: Spouse/significant other;Children Available Help at Discharge: Family;Available 24 hours/day Type of Home: House Home Access: Ramped entrance     Home Layout: One level Home Equipment: Walker - 2 wheels;Cane - single point;Shower seat - built in;Wheelchair - manual      Prior Function Level of Independence: Independent         Comments: Household and short distanced community ambulator, was driving up to 2 weeks ago     Hand Dominance  Extremity/Trunk Assessment   Upper Extremity Assessment Upper Extremity Assessment: Overall WFL for tasks assessed    Lower Extremity Assessment Lower Extremity Assessment: Generalized weakness    Cervical / Trunk Assessment Cervical / Trunk Assessment: Normal   Communication   Communication: No difficulties  Cognition Arousal/Alertness: Awake/alert Behavior During Therapy: WFL for tasks assessed/performed Overall Cognitive Status: Within Functional Limits for tasks assessed                                        General Comments      Exercises     Assessment/Plan    PT Assessment Patient needs continued PT services  PT Problem List Decreased strength;Decreased activity tolerance;Decreased balance;Decreased mobility       PT Treatment Interventions Gait training;Balance training;Stair training;Functional mobility training;Therapeutic activities;Therapeutic exercise;Patient/family education    PT Goals (Current goals can be found in the Care Plan section)  Acute Rehab PT Goals Patient Stated Goal: return home with family to assist PT Goal Formulation: With patient Time For Goal Achievement: 10/09/19 Potential to Achieve Goals: Good    Frequency Min 3X/week   Barriers to discharge        Co-evaluation               AM-PAC PT "6 Clicks" Mobility  Outcome Measure Help needed turning from your back to your side while in a flat bed without using bedrails?: None Help needed moving from lying on your back to sitting on the side of a flat bed without using bedrails?: None Help needed moving to and from a bed to a chair (including a wheelchair)?: A Little Help needed standing up from a chair using your arms (e.g., wheelchair or bedside chair)?: A Little Help needed to walk in hospital room?: A Little Help needed climbing 3-5 steps with a railing? : A Lot 6 Click Score: 19    End of Session Equipment Utilized During Treatment: Gait belt Activity Tolerance: Patient tolerated treatment well;Patient limited by fatigue Patient left: in chair;with call bell/phone within reach Nurse Communication: Mobility status PT Visit Diagnosis: Unsteadiness on feet (R26.81);Other abnormalities of gait and mobility  (R26.89);Muscle weakness (generalized) (M62.81)    Time: 1027-2536 PT Time Calculation (min) (ACUTE ONLY): 29 min   Charges:   PT Evaluation $PT Eval Moderate Complexity: 1 Mod PT Treatments $Therapeutic Activity: 23-37 mins        10:38 AM, 10/06/19 Ocie Bob, MPT Physical Therapist with [facility] [phone] office [phone] mobile phone

## 2019-10-06 NOTE — Plan of Care (Signed)
  Problem: Acute Rehab PT Goals(only PT should resolve) Goal: Pt Will Go Supine/Side To Sit Outcome: Progressing Flowsheets (Taken 10/06/2019 1039) Pt will go Supine/Side to Sit: Independently Goal: Patient Will Perform Sitting Balance Outcome: Progressing Flowsheets (Taken 10/06/2019 1039) Patient will perform sitting balance: with supervision Goal: Patient Will Transfer Sit To/From Stand Outcome: Progressing Flowsheets (Taken 10/06/2019 1039) Patient will transfer sit to/from stand: with supervision Goal: Pt Will Ambulate Outcome: Progressing Flowsheets (Taken 10/06/2019 1039) Pt will Ambulate:  75 feet  with supervision  with rolling walker   10:39 AM, 10/06/19 Lonell Grandchild, MPT Physical Therapist with [facility] [phone] office [phone] mobile phone

## 2019-10-06 NOTE — Discharge Summary (Signed)
Physician Discharge Summary  Tiffany [name] IRW:[id number] DOB: September 06, 1934 DOA: 10/05/2019  PCP: Dettinger, Fransisca Kaufmann, MD  Admit date: 10/05/2019 Discharge date: 10/06/2019  Time spent: 35 minutes  Recommendations for Outpatient Follow-up:  1. Repeat basic metabolic panel to follow sodium trend.   Discharge Diagnoses:  Principal Problem:   Dehydration with hyponatremia Active Problems:   Hypothyroidism   Weakness   Discharge Condition: Stable and improved.  Patient discharged home with instruction to follow-up with PCP in 1 week.  Home health PT arranged at discharge.  Patient is a full code.  Diet recommendation: Regular diet.  Filed Weights   10/05/19 0756  Weight: 66.2 kg    History of present illness:  85 y.o. female with past medical history of diverticulosis hyperlipidemia, hypothyroidism and and glaucoma; who recently completed treatment for diverticulitis flare as an outpatient and has experienced since then ongoing weakness.  Patient reports in the last week ability to maintain adequate hydration and nutrition; in fact expressed to be drinking significant amount of water as instructed by her provider (patient expressed drinking up to 8 bottles of 16 ounces 1 day); she is present feeling significantly weak and easily fatigued.  No fever, no chills, no nausea, no vomiting, no abdominal pain, no dysuria, no hematuria, no melena, no hematochezia, no diarrhea currently, no headaches, no focal deficits or any other complaints.  In the ED work-up demonstrated a sodium level of 119; 1 L of normal saline bolus was provided as a trial management for hyponatremia; TRH consulted to place patient in the hospital for further evaluation and management.  Hospital Course:  1-dehydration with hyponatremia -No seizure or encephalopathy appreciated -Sodium level 119-122 on presentation. -Patient denies nausea or vomiting currently. -Fluid restriction and sodium tablet x1  provided. -Patient instructed to maintain adequate hydration without overdoing plain water intake. -At discharge sodium level 134 and patient essentially asymptomatic. -Recommending follow-up with PCP in 1 week for repeated blood work.  2-Hypothyroidism -Currently no using any medications -TSH within normal limits.  3-Weakness -Associated with hyponatremia slightly. -Physical therapy has seen patient and recommended home health PT -Home health services will be arranged at time of discharge.  4-recent diverticulitis flare -Patient denies nausea, vomiting and/or abdominal pain -She completed treatment with Cipro and Flagyl as an outpatient. -Continue to maintain adequate hydration and to follow soft diet.  5-history of glaucoma -Continue latanoprost eyedrops.  Procedures: None  Consultations:  None  Discharge Exam: Vitals:   10/06/19 0442 10/06/19 0815  BP: (!) 147/72 (!) 125/55  Pulse: 78 85  Resp: 17   Temp: 98.3 F (36.8 C)   SpO2: 100% 98%    General: Afebrile, no chest pain, no nausea, no vomiting, feeling much better today. Cardiovascular: S1 and S2, no rubs, no gallops, no JVD. Respiratory: Clear to auscultation bilaterally, normal respiratory effort. Abdomen: Soft, nontender, nondistended, positive bowel sounds. Extremities: No cyanosis or clubbing.  Discharge Instructions   Discharge Instructions    Discharge instructions   Complete by: As directed    Take medications as prescribed. Maintain adequate hydration (about 3 16 ounces bottle of water daily, max). Arrange follow-up with PCP in 1 week. Follow instructions by home health physical therapy staff to assist with conditioning and rehabilitation.   Increase activity slowly   Complete by: As directed      Allergies as of 10/06/2019      Reactions   Penicillins Hives   Did it involve swelling of the face/tongue/throat, SOB, or low BP? No  Did it involve sudden or severe rash/hives, skin  peeling, or any reaction on the inside of your mouth or nose? No Did you need to seek medical attention at a hospital or doctor's office? No When did it last happen? If all above answers are "NO", may proceed with cephalosporin use.   Sulfa Antibiotics    Tetanus Toxoids Hives      Medication List    TAKE these medications   latanoprost 0.005 % ophthalmic solution Commonly known as: XALATAN 1 drop at bedtime.   Magnesium 400 MG Tabs Take 600 mg by mouth daily. What changed: medication strength   OVER THE COUNTER MEDICATION Thyroid support   vitamin B-12 100 MCG tablet Commonly known as: CYANOCOBALAMIN Take 100 mcg by mouth daily.      Allergies  Allergen Reactions  . Penicillins Hives    Did it involve swelling of the face/tongue/throat, SOB, or low BP? No Did it involve sudden or severe rash/hives, skin peeling, or any reaction on the inside of your mouth or nose? No Did you need to seek medical attention at a hospital or doctor's office? No When did it last happen? If all above answers are "NO", may proceed with cephalosporin use.    . Sulfa Antibiotics   . Tetanus Toxoids Hives   Follow-up Information    [facility] Follow up.   Why: [facility] team will call to schedule their first visit to see you for Physical Therapy at home.       Dettinger, Elige RadonJoshua A, MD. Schedule an appointment as soon as possible for a visit in 1 week(s).   Specialties: Family Medicine, Cardiology Contact information: [address] [place] [address] [phone]           The results of significant diagnostics from this hospitalization (including imaging, microbiology, ancillary and laboratory) are listed below for reference.    Significant Diagnostic Studies: Dg Chest 2 View  Result Date: 10/01/2019 CLINICAL DATA:  Weakness. EXAM: CHEST - 2 VIEW COMPARISON:  None. FINDINGS: The cardiomediastinal contours are normal. Borderline cardiomegaly  with atherosclerosis of the aortic arch. Pulmonary vasculature is normal. No consolidation, pleural effusion, or pneumothorax. No acute osseous abnormalities are seen. IMPRESSION: 1. No acute abnormality. 2. Borderline cardiomegaly with thoracic aortic atherosclerosis. Aortic Atherosclerosis (ICD10-I70.0). Electronically Signed   By: Narda RutherfordMelanie  Sanford M.D.   On: 10/01/2019 13:41    Microbiology: Recent Results (from the past 240 hour(s))  SARS CORONAVIRUS 2 (TAT 6-24 HRS) Nasopharyngeal Nasopharyngeal Swab     Status: None   Collection Time: 10/05/19  8:08 AM   Specimen: Nasopharyngeal Swab  Result Value Ref Range Status   SARS Coronavirus 2 NEGATIVE NEGATIVE Final    Comment: (NOTE) SARS-CoV-2 target nucleic acids are NOT DETECTED. The SARS-CoV-2 RNA is generally detectable in upper and lower respiratory specimens during the acute phase of infection. Negative results do not preclude SARS-CoV-2 infection, do not rule out co-infections with other pathogens, and should not be used as the sole basis for treatment or other patient management decisions. Negative results must be combined with clinical observations, patient history, and epidemiological information. The expected result is Negative. Fact Sheet for Patients: HairSlick.nohttps://www.fda.gov/media/138098/download Fact Sheet for Healthcare Providers: quierodirigir.comhttps://www.fda.gov/media/138095/download This test is not yet approved or cleared by the [place] FDA and  has been authorized for detection and/or diagnosis of SARS-CoV-2 by FDA under an Emergency Use Authorization (EUA). This EUA will remain  in effect (meaning this test can be used) for  the duration of the COVID-19 declaration under Section 56 4(b)(1) of the Act, 21 U.S.C. section 360bbb-3(b)(1), unless the authorization is terminated or revoked sooner. Performed at [facility], [address], [place], [place] [address]      Labs: Basic Metabolic Panel: Recent Labs  Lab  10/01/19 1349 10/05/19 0836 10/05/19 1039 10/06/19 0543  NA 129* 122* 125* 134*  K 4.1 4.0 4.4 4.0  CL 95* 90* 95* 102  CO2 24 19* 20* 24  GLUCOSE 101* 117* 112* 106*  BUN 13 12 12 11   CREATININE 0.55 0.48 0.49 0.61  CALCIUM 9.1 8.6* 8.6* 9.0   Liver Function Tests: Recent Labs  Lab 10/05/19 0836  AST 22  ALT 17  ALKPHOS 62  BILITOT 0.3  PROT 6.6  ALBUMIN 3.8   CBC: Recent Labs  Lab 10/01/19 1349 10/05/19 0836  WBC 8.7 8.0  NEUTROABS 6.3 5.9  HGB 13.0 13.4  HCT 39.1 39.7  MCV 93.3 91.9  PLT 353 362   Cardiac Enzymes: Recent Labs  Lab 10/05/19 0836  CKTOTAL 124    Signed:  10/07/19 MD.  [facility] 10/06/2019, 11:18 AM

## 2019-10-06 NOTE — TOC Progression Note (Signed)
  Medicare.gov - the Conservation officer, historic buildings for [facility] health agencies that serve [place], [place]. [place] Quality of Patient Care Rating Patient Survey Summary Rating [facility] [phone] 4  out of 5 stars 5 out of [place] [phone] 4  out of 5 stars 4 out of [place] [phone] 4 out of 5 stars 4 out of [place] [phone] 4 out of 5 stars 4 out of 5 stars [facility] [phone] 3  out of 5 stars 4 out of [place] [phone] 3 out of 5 stars 4 out of [place] [phone] 3  out of 5 stars 4 out of [place] [phone]  out of 5 stars 4 out of 5 stars

## 2019-10-08 ENCOUNTER — Encounter: Payer: Self-pay | Admitting: Family Medicine

## 2019-10-08 ENCOUNTER — Telehealth: Payer: Self-pay | Admitting: Family Medicine

## 2019-10-08 NOTE — Telephone Encounter (Signed)
Cipro added to Allergy list.

## 2019-10-13 ENCOUNTER — Other Ambulatory Visit: Payer: Self-pay

## 2019-10-13 ENCOUNTER — Encounter (HOSPITAL_COMMUNITY): Payer: Self-pay

## 2019-10-13 ENCOUNTER — Ambulatory Visit: Payer: Medicare Other | Admitting: Family Medicine

## 2019-10-13 ENCOUNTER — Encounter: Payer: Self-pay | Admitting: Family Medicine

## 2019-10-13 ENCOUNTER — Ambulatory Visit (INDEPENDENT_AMBULATORY_CARE_PROVIDER_SITE_OTHER): Payer: Medicare Other | Admitting: Family Medicine

## 2019-10-13 ENCOUNTER — Ambulatory Visit (INDEPENDENT_AMBULATORY_CARE_PROVIDER_SITE_OTHER): Payer: Medicare Other

## 2019-10-13 ENCOUNTER — Emergency Department (HOSPITAL_COMMUNITY): Payer: Medicare Other

## 2019-10-13 ENCOUNTER — Emergency Department (HOSPITAL_COMMUNITY)
Admission: EM | Admit: 2019-10-13 | Discharge: 2019-10-13 | Disposition: A | Discharge status: Home / Self Care | Payer: Medicare Other | Attending: Emergency Medicine | Admitting: Emergency Medicine

## 2019-10-13 VITALS — BP 156/76 | HR 86 | Temp 98.4°F | Ht 67.0 in | Wt 144.0 lb

## 2019-10-13 DIAGNOSIS — E871 Hypo-osmolality and hyponatremia: Secondary | ICD-10-CM | POA: Diagnosis not present

## 2019-10-13 DIAGNOSIS — N3000 Acute cystitis without hematuria: Secondary | ICD-10-CM | POA: Diagnosis not present

## 2019-10-13 DIAGNOSIS — R531 Weakness: Secondary | ICD-10-CM | POA: Diagnosis not present

## 2019-10-13 DIAGNOSIS — E039 Hypothyroidism, unspecified: Secondary | ICD-10-CM | POA: Insufficient documentation

## 2019-10-13 DIAGNOSIS — Z79899 Other long term (current) drug therapy: Secondary | ICD-10-CM | POA: Insufficient documentation

## 2019-10-13 DIAGNOSIS — Z20828 Contact with and (suspected) exposure to other viral communicable diseases: Secondary | ICD-10-CM | POA: Diagnosis not present

## 2019-10-13 DIAGNOSIS — E860 Dehydration: Secondary | ICD-10-CM

## 2019-10-13 LAB — COMPREHENSIVE METABOLIC PANEL
ALT: 13 U/L (ref 0–44)
AST: 16 U/L (ref 15–41)
Albumin: 4.5 g/dL (ref 3.5–5.0)
Alkaline Phosphatase: 46 U/L (ref 38–126)
Anion gap: 10 (ref 5–15)
BUN: 14 mg/dL (ref 8–23)
CO2: 24 mmol/L (ref 22–32)
Calcium: 9.4 mg/dL (ref 8.9–10.3)
Chloride: 99 mmol/L (ref 98–111)
Creatinine, Ser: 0.6 mg/dL (ref 0.44–1.00)
GFR calc Af Amer: >60 mL/min (ref >60)
GFR calc non Af Amer: >60 mL/min (ref >60)
Glucose, Bld: 104 mg/dL — ABNORMAL HIGH (ref 70–99)
Potassium: 4 mmol/L (ref 3.5–5.1)
Sodium: 133 mmol/L — ABNORMAL LOW (ref 135–145)
Total Bilirubin: 0.7 mg/dL (ref 0.3–1.2)
Total Protein: 7.2 g/dL (ref 6.5–8.1)

## 2019-10-13 LAB — CBC WITH DIFFERENTIAL/PLATELET
Abs Immature Granulocytes: 0.01 10*3/uL (ref 0.00–0.07)
Basophils Absolute: 0.1 10*3/uL (ref 0.0–0.1)
Basophils Relative: 1 %
Eosinophils Absolute: 0 10*3/uL (ref 0.0–0.5)
Eosinophils Relative: 1 %
HCT: 43.9 % (ref 36.0–46.0)
Hemoglobin: 14.5 g/dL (ref 12.0–15.0)
Immature Granulocytes: 0 %
Lymphocytes Relative: 22 %
Lymphs Abs: 1.7 10*3/uL (ref 0.7–4.0)
MCH: 31.3 pg (ref 26.0–34.0)
MCHC: 33 g/dL (ref 30.0–36.0)
MCV: 94.6 fL (ref 80.0–100.0)
Monocytes Absolute: 0.6 10*3/uL (ref 0.1–1.0)
Monocytes Relative: 8 %
Neutro Abs: 5.3 10*3/uL (ref 1.7–7.7)
Neutrophils Relative %: 68 %
Platelets: 391 10*3/uL (ref 150–400)
RBC: 4.64 MIL/uL (ref 3.87–5.11)
RDW: 15.1 % (ref 11.5–15.5)
WBC: 7.7 10*3/uL (ref 4.0–10.5)
nRBC: 0 % (ref 0.0–0.2)

## 2019-10-13 LAB — URINALYSIS, COMPLETE
Bilirubin, UA: NEGATIVE
Glucose, UA: NEGATIVE
Ketones, UA: NEGATIVE
Nitrite, UA: NEGATIVE
Protein,UA: NEGATIVE
RBC, UA: NEGATIVE
Specific Gravity, UA: 1.01 (ref 1.005–1.030)
Urobilinogen, Ur: 0.2 mg/dL (ref 0.2–1.0)
pH, UA: 6 (ref 5.0–7.5)

## 2019-10-13 LAB — URINALYSIS, ROUTINE W REFLEX MICROSCOPIC
Bilirubin Urine: NEGATIVE
Glucose, UA: NEGATIVE mg/dL
Hgb urine dipstick: NEGATIVE
Ketones, ur: NEGATIVE mg/dL
Leukocytes,Ua: NEGATIVE
Nitrite: NEGATIVE
Protein, ur: NEGATIVE mg/dL
Specific Gravity, Urine: 1.005 (ref 1.005–1.030)
pH: 6 (ref 5.0–8.0)

## 2019-10-13 LAB — MICROSCOPIC EXAMINATION

## 2019-10-13 LAB — POC SARS CORONAVIRUS 2 AG -  ED: SARS Coronavirus 2 Ag: NEGATIVE

## 2019-10-13 MED ORDER — SODIUM CHLORIDE 0.9 % IV BOLUS
1000.0000 mL | Freq: Once | INTRAVENOUS | Status: AC
  Administered 2019-10-13: 1000 mL via INTRAVENOUS

## 2019-10-13 MED ORDER — CEPHALEXIN 500 MG PO CAPS: 500.0000 mg | ORAL_CAPSULE | Freq: Two times a day (BID) | ORAL | 0 refills | Status: DC

## 2019-10-13 MED ORDER — DOXYCYCLINE HYCLATE 100 MG PO TABS
100.0000 mg | ORAL_TABLET | Freq: Once | ORAL | Status: DC
  Filled 2019-10-13: qty 1

## 2019-10-13 NOTE — ED Triage Notes (Signed)
Pt sent to ED by Tiffany [name] for weakness. Per MD, pt recently discharged from hospital for hyponatremia and has not gotten better.

## 2019-10-13 NOTE — ED Provider Notes (Signed)
[facility] EMERGENCY DEPARTMENT Provider Note   CSN: [id number] Arrival date & time: 10/13/19  1308     History   Chief Complaint Chief Complaint  Patient presents with  . Weakness    HPI Tiffany [name] is a 85 y.o. female.     Patient complains of weakness.  She was seen by her doctor and he said her sodium was low and she needed to be seen in the hospital  The history is provided by the patient and medical records. No language interpreter was used.  Weakness Severity:  Mild Onset quality:  Gradual Timing:  Constant Progression:  Improving Chronicity:  Recurrent Context: not alcohol use   Relieved by:  Nothing Worsened by:  Nothing Ineffective treatments:  None tried Associated symptoms: no abdominal pain, no chest pain, no cough, no diarrhea, no frequency, no headaches and no seizures     Past Medical History:  Diagnosis Date  . Diverticulitis   . Hyperlipidemia   . Thyroid disease     Patient Active Problem List   Diagnosis Date Noted  . Dehydration with hyponatremia 10/05/2019  . Weakness 10/05/2019  . Hypothyroidism 05/29/2019  . Overweight (BMI 25.0-29.9) 05/29/2019    Past Surgical History:  Procedure Laterality Date  . CATARACT EXTRACTION       OB History   No obstetric history on file.      Home Medications    Prior to Admission medications   Medication Sig Start Date End Date Taking? Authorizing Provider  cephALEXin (KEFLEX) 500 MG capsule Take 1 capsule (500 mg total) by mouth 2 (two) times daily. 10/13/19   Dettinger, Fransisca Kaufmann, MD  latanoprost (XALATAN) 0.005 % ophthalmic solution 1 drop at bedtime.    [provider]  MAGNESIUM-OXIDE 400 (241.3 Mg) MG tablet Take 1.5 tablets by mouth daily. 10/06/19   [provider]  OVER THE COUNTER MEDICATION Thyroid support    [provider]  vitamin B-12 (CYANOCOBALAMIN) 100 MCG tablet Take 100 mcg by mouth daily.    [provider]    Family History  Family History  Problem Relation Age of Onset  . Diabetes Mother   . Stroke Mother     Social History Social History   Tobacco Use  . Smoking status: Never Smoker  . Smokeless tobacco: Never Used  Substance Use Topics  . Alcohol use: Never    Frequency: Never  . Drug use: Never     Allergies   Ciprocin-fluocin-procin [fluocinolone], Penicillins, Sulfa antibiotics, and Tetanus toxoids   Review of Systems Review of Systems  Constitutional: Negative for appetite change and fatigue.  HENT: Negative for congestion, ear discharge and sinus pressure.   Eyes: Negative for discharge.  Respiratory: Negative for cough.   Cardiovascular: Negative for chest pain.  Gastrointestinal: Negative for abdominal pain and diarrhea.  Genitourinary: Negative for frequency and hematuria.  Musculoskeletal: Negative for back pain.  Skin: Negative for rash.  Neurological: Positive for weakness. Negative for seizures and headaches.  Psychiatric/Behavioral: Negative for hallucinations.     Physical Exam Updated Vital Signs BP (!) 171/70   Pulse 81   Temp 98.5 F (36.9 C) (Oral)   Resp 17   Ht 5\' 7"  (1.702 m)   Wt 65.3 kg   SpO2 100%   BMI 22.55 kg/m   Physical Exam Vitals and nursing note reviewed.  Constitutional:      Appearance: She is well-developed.  HENT:     Head: Normocephalic.     Nose:  Nose normal.  Eyes:     General: No scleral icterus.    Conjunctiva/sclera: Conjunctivae normal.  Neck:     Thyroid: No thyromegaly.  Cardiovascular:     Rate and Rhythm: Normal rate and regular rhythm.     Heart sounds: No murmur. No friction rub. No gallop.   Pulmonary:     Breath sounds: No stridor. No wheezing or rales.  Chest:     Chest wall: No tenderness.  Abdominal:     General: There is no distension.     Tenderness: There is no abdominal tenderness. There is no rebound.  Musculoskeletal:        General: Normal range of motion.     Cervical back: Neck supple.   Lymphadenopathy:     Cervical: No cervical adenopathy.  Skin:    Findings: No erythema or rash.  Neurological:     Mental Status: She is oriented to person, place, and time.     Motor: No abnormal muscle tone.     Coordination: Coordination normal.  Psychiatric:        Behavior: Behavior normal.      ED Treatments / Results  Labs (all labs ordered are listed, but only abnormal results are displayed) Labs Reviewed  COMPREHENSIVE METABOLIC PANEL - Abnormal; Notable for the following components:      Result Value   Sodium 133 (*)    Glucose, Bld 104 (*)    All other components within normal limits  URINALYSIS, ROUTINE W REFLEX MICROSCOPIC - Abnormal; Notable for the following components:   Color, Urine STRAW (*)    All other components within normal limits  URINE CULTURE  CBC WITH DIFFERENTIAL/PLATELET  POC SARS CORONAVIRUS 2 AG -  ED    EKG None  Radiology Dg Chest 2 View  Result Date: 10/13/2019 CLINICAL DATA:  Hyponatremia, weakness and fatigue EXAM: CHEST - 2 VIEW COMPARISON:  Radiograph 10/01/2019 FINDINGS: There is hyperexpansion of the lungs with flattening of the diaphragms. No consolidation, features of edema, pneumothorax, or effusion. The aorta is calcified. The remaining cardiomediastinal contours are unremarkable. No acute osseous or soft tissue abnormality. Degenerative changes are present in the imaged spine and shoulders. Mild dextrocurvature of the thoracic spine, possibly positional. IMPRESSION: 1.  No acute cardiopulmonary abnormality. 2. Features suggestive of COPD. 3. Aortic atherosclerosis. Aortic Atherosclerosis (ICD10-I70.0). Electronically Signed   By: Kreg ShropshirePrice  DeHay M.D.   On: 10/13/2019 16:10   Dg Chest Portable 1 View  Result Date: 10/13/2019 CLINICAL DATA:  Hyponatremia and weakness EXAM: PORTABLE CHEST 1 VIEW COMPARISON:  Film from earlier in the same day. FINDINGS: Cardiac shadow is stable. Aortic calcifications are again seen. The lungs are  hyperaerated bilaterally. No focal infiltrate or sizable effusion is seen. No acute bony abnormality is noted. IMPRESSION: COPD without acute abnormality. Aortic Atherosclerosis (ICD10-I70.0). Electronically Signed   By: Alcide CleverMark  Lukens M.D.   On: 10/13/2019 18:16    Procedures Procedures (including critical care time)  Medications Ordered in ED Medications  doxycycline (VIBRA-TABS) tablet 100 mg (has no administration in time range)  sodium chloride 0.9 % bolus 1,000 mL (0 mLs Intravenous Stopped 10/13/19 1758)     Initial Impression / Assessment and Plan / ED Course  I have reviewed the triage vital signs and the nursing notes.  Pertinent labs & imaging results that were available during my care of the patient were reviewed by me and considered in my medical decision making (see chart for details).  Patient's labs do not show significant hyponatremia.  She does have a urinary tract infection and she felt better with fluids.  She will get her prescription of antibiotics that her doctor wrote and follow-up with him  Final Clinical Impressions(s) / ED Diagnoses   Final diagnoses:  Acute cystitis without hematuria    ED Discharge Orders    None       Bethann Berkshire, MD 10/17/19 [phone]

## 2019-10-13 NOTE — Discharge Instructions (Addendum)
Get the antibiotic tomorrow that your doctor prescribed and drink plenty of fluids and follow-up with your doctor next week

## 2019-10-13 NOTE — Progress Notes (Signed)
BP (!) 156/76   Pulse 86   Temp 98.4 F (36.9 C) (Temporal)   Ht '5\' 7"'  (1.702 m)   Wt 144 lb (65.3 kg)   SpO2 98%   BMI 22.55 kg/m    Subjective:   Patient ID: Tiffany [name], female    DOB: [birth year], 85 y.o.   MRN: [id number]  HPI: Tiffany [name] is a 85 y.o. female presenting on 10/13/2019 for Hospitalization Follow-up (AP-11/29)   HPI Patient is coming in today with increased weakness and feeling down and feeling like she is going to fall out.  She had this similarly when she went into the emergency department on 10/05/2019 and was found to be hyponatremic in the 122 range.  She was up to 134 sodium when she was discharged and she was feeling a lot better but today all of a sudden she is feeling worse exactly like when she went to the emergency department.  She says she does have some lower abdominal pressure as well.  She denies any fevers or chills.  She does feel like she has some congestion that is mild and a little bit of a cough.  She denies any shortness of breath or wheezing.  She just generally feels weak and like she has no energy and is just cannot fall down.  Relevant past medical, surgical, family and social history reviewed and updated as indicated. Interim medical history since our last visit reviewed. Allergies and medications reviewed and updated.  Review of Systems  Constitutional: Positive for fatigue. Negative for chills and fever.  HENT: Negative for congestion, ear discharge and ear pain.   Eyes: Negative for redness and visual disturbance.  Respiratory: Negative for chest tightness and shortness of breath.   Cardiovascular: Negative for chest pain and leg swelling.  Gastrointestinal: Positive for abdominal pain.  Genitourinary: Negative for difficulty urinating, dysuria, flank pain and frequency.  Musculoskeletal: Negative for back pain and gait problem.  Skin: Negative for rash.  Neurological: Positive for weakness and light-headedness. Negative for  numbness and headaches.  Psychiatric/Behavioral: Negative for agitation and behavioral problems.  All other systems reviewed and are negative.   Per HPI unless specifically indicated above   Allergies as of 10/13/2019      Reactions   Ciprocin-fluocin-procin [fluocinolone]    Penicillins Hives   Did it involve swelling of the face/tongue/throat, SOB, or low BP? No Did it involve sudden or severe rash/hives, skin peeling, or any reaction on the inside of your mouth or nose? No Did you need to seek medical attention at a hospital or doctor's office? No When did it last happen? If all above answers are "NO", may proceed with cephalosporin use.   Sulfa Antibiotics    Tetanus Toxoids Hives      Medication List       Accurate as of October 13, 2019 12:35 PM. If you have any questions, ask your nurse or doctor.        STOP taking these medications   Magnesium 400 MG Tabs Stopped by: Fransisca Kaufmann Suni Jarnagin, MD     TAKE these medications   cephALEXin 500 MG capsule Commonly known as: KEFLEX Take 1 capsule (500 mg total) by mouth 2 (two) times daily. Started by: Fransisca Kaufmann Eleanore Junio, MD   latanoprost 0.005 % ophthalmic solution Commonly known as: XALATAN 1 drop at bedtime.   MAGnesium-Oxide 400 (241.3 Mg) MG tablet Generic drug: magnesium oxide Take 1.5 tablets by mouth daily.   OVER THE COUNTER  MEDICATION Thyroid support   vitamin B-12 100 MCG tablet Commonly known as: CYANOCOBALAMIN Take 100 mcg by mouth daily.        Objective:   BP (!) 156/76   Pulse 86   Temp 98.4 F (36.9 C) (Temporal)   Ht '5\' 7"'  (1.702 m)   Wt 144 lb (65.3 kg)   SpO2 98%   BMI 22.55 kg/m   Wt Readings from Last 3 Encounters:  10/13/19 144 lb (65.3 kg)  10/05/19 146 lb (66.2 kg)  10/01/19 146 lb (66.2 kg)    Physical Exam Vitals signs and nursing note reviewed.  Constitutional:      General: She is not in acute distress.    Appearance: She is well-developed. She is not  diaphoretic.  Eyes:     Conjunctiva/sclera: Conjunctivae normal.  Cardiovascular:     Rate and Rhythm: Normal rate and regular rhythm.     Heart sounds: Normal heart sounds. No murmur.  Pulmonary:     Effort: Pulmonary effort is normal. No respiratory distress.     Breath sounds: Normal breath sounds. No wheezing.  Abdominal:     General: Abdomen is flat. Bowel sounds are normal. There is no distension.     Tenderness: There is abdominal tenderness (Mild suprapubic abdominal pressure on exam). There is no right CVA tenderness, left CVA tenderness, guarding or rebound.  Skin:    General: Skin is warm and dry.     Findings: No rash.  Neurological:     Mental Status: She is alert and oriented to person, place, and time.     Coordination: Coordination normal.  Psychiatric:        Behavior: Behavior normal.    Chest x-ray: No signs of acute cardiopulmonary abnormalities.  Await final read from radiology.  Urinalysis: 6-10 WBCs, 0-2 RBCs, 0-10 epithelial cells, few bacteria, trace leukocytes, may be a mild UTI  Placed blood work but I am not going to be able to get it back before tomorrow.  Assessment & Plan:   Problem List Items Addressed This Visit      Other   Dehydration with hyponatremia - Primary   Relevant Orders   CMP14+EGFR   CBC with Differential/Platelet   urinalysis- dip and micro   DG Chest 2 View   Weakness   Relevant Orders   CMP14+EGFR   CBC with Differential/Platelet   urinalysis- dip and micro   DG Chest 2 View    Other Visit Diagnoses    Acute cystitis without hematuria       Relevant Medications   cephALEXin (KEFLEX) 500 MG capsule      Patient is feeling severely weak like she did prior to go to the emergency department she is opted to go back to the emergency department. Follow up plan: Return if symptoms worsen or fail to improve, for Follow-up after ER visit.  Counseling provided for all of the vaccine components Orders Placed This Encounter   Procedures  . DG Chest 2 View  . CMP14+EGFR  . CBC with Differential/Platelet  . urinalysis- dip and micro    Caryl Pina, MD [facility] 10/13/2019, 12:35 PM

## 2019-10-14 ENCOUNTER — Telehealth: Payer: Self-pay | Admitting: Family Medicine

## 2019-10-14 LAB — CBC WITH DIFFERENTIAL/PLATELET
Basophils Absolute: 0.1 10*3/uL (ref 0.0–0.2)
Basos: 1 %
EOS (ABSOLUTE): 0 10*3/uL (ref 0.0–0.4)
Eos: 0 %
Hematocrit: 41.1 % (ref 34.0–46.6)
Hemoglobin: 14.1 g/dL (ref 11.1–15.9)
Immature Grans (Abs): 0 10*3/uL (ref 0.0–0.1)
Immature Granulocytes: 0 %
Lymphocytes Absolute: 1.6 10*3/uL (ref 0.7–3.1)
Lymphs: 19 %
MCH: 31.4 pg (ref 26.6–33.0)
MCHC: 34.3 g/dL (ref 31.5–35.7)
MCV: 92 fL (ref 79–97)
Monocytes Absolute: 0.7 10*3/uL (ref 0.1–0.9)
Monocytes: 9 %
Neutrophils Absolute: 6.1 10*3/uL (ref 1.4–7.0)
Neutrophils: 71 %
Platelets: 403 10*3/uL (ref 150–450)
RBC: 4.49 x10E6/uL (ref 3.77–5.28)
RDW: 14.8 % (ref 11.7–15.4)
WBC: 8.6 10*3/uL (ref 3.4–10.8)

## 2019-10-14 LAB — CMP14+EGFR
ALT: 13 IU/L (ref 0–32)
AST: 17 IU/L (ref 0–40)
Albumin/Globulin Ratio: 1.9 (ref 1.2–2.2)
Albumin: 4.4 g/dL (ref 3.6–4.6)
Alkaline Phosphatase: 55 IU/L (ref 39–117)
BUN/Creatinine Ratio: 17 (ref 12–28)
BUN: 13 mg/dL (ref 8–27)
Bilirubin Total: 0.4 mg/dL (ref 0.0–1.2)
CO2: 22 mmol/L (ref 20–29)
Calcium: 9.5 mg/dL (ref 8.7–10.3)
Chloride: 97 mmol/L (ref 96–106)
Creatinine, Ser: 0.77 mg/dL (ref 0.57–1.00)
GFR calc Af Amer: 83 mL/min/{1.73_m2} (ref >59)
GFR calc non Af Amer: 72 mL/min/{1.73_m2} (ref >59)
Globulin, Total: 2.3 g/dL (ref 1.5–4.5)
Glucose: 90 mg/dL (ref 65–99)
Potassium: 4.5 mmol/L (ref 3.5–5.2)
Sodium: 135 mmol/L (ref 134–144)
Total Protein: 6.7 g/dL (ref 6.0–8.5)

## 2019-10-14 NOTE — Telephone Encounter (Signed)
Spoke to Tiffany [name] at the pharmacy and advised him of MD feedback and he voiced understanding. He also said he hasn't gotten anything from the doctor at the ED last night.

## 2019-10-14 NOTE — Telephone Encounter (Signed)
Pharmacist called just wanting to verify you are ok with her taking Keflex due to her recent reaction to the Cipro and her other antibiotic allergies.

## 2019-10-14 NOTE — Telephone Encounter (Signed)
I would say most likely yes but currently she went to the emergency department so I do not know if they are going to treat her with something else so we will have to see what they do from the emergency department before taking the Keflex but yes I would initially recommend yes, I cannot see the ER doc's note yet from yesterday

## 2019-10-15 LAB — URINE CULTURE: Culture: NO GROWTH

## 2019-10-23 ENCOUNTER — Ambulatory Visit (INDEPENDENT_AMBULATORY_CARE_PROVIDER_SITE_OTHER): Payer: Medicare Other

## 2019-10-23 ENCOUNTER — Other Ambulatory Visit: Payer: Self-pay

## 2019-10-23 DIAGNOSIS — K5790 Diverticulosis of intestine, part unspecified, without perforation or abscess without bleeding: Secondary | ICD-10-CM

## 2019-10-23 DIAGNOSIS — H409 Unspecified glaucoma: Secondary | ICD-10-CM

## 2019-10-23 DIAGNOSIS — E871 Hypo-osmolality and hyponatremia: Secondary | ICD-10-CM | POA: Diagnosis not present

## 2019-10-23 DIAGNOSIS — E785 Hyperlipidemia, unspecified: Secondary | ICD-10-CM

## 2019-10-23 DIAGNOSIS — E039 Hypothyroidism, unspecified: Secondary | ICD-10-CM | POA: Diagnosis not present

## 2019-10-23 DIAGNOSIS — Z6825 Body mass index (BMI) 25.0-25.9, adult: Secondary | ICD-10-CM

## 2019-10-23 DIAGNOSIS — I700 Atherosclerosis of aorta: Secondary | ICD-10-CM

## 2019-12-09 ENCOUNTER — Other Ambulatory Visit: Payer: Self-pay

## 2019-12-10 ENCOUNTER — Ambulatory Visit: Payer: Medicare Other | Admitting: Family Medicine

## 2019-12-23 ENCOUNTER — Other Ambulatory Visit: Payer: Self-pay

## 2019-12-24 ENCOUNTER — Encounter: Payer: Self-pay | Admitting: Family Medicine

## 2019-12-24 ENCOUNTER — Ambulatory Visit (INDEPENDENT_AMBULATORY_CARE_PROVIDER_SITE_OTHER): Payer: Medicare Other | Admitting: Family Medicine

## 2019-12-24 VITALS — BP 146/76 | HR 83 | Temp 97.3°F | Ht 67.0 in | Wt 139.4 lb

## 2019-12-24 DIAGNOSIS — F419 Anxiety disorder, unspecified: Secondary | ICD-10-CM | POA: Diagnosis not present

## 2019-12-24 DIAGNOSIS — F5104 Psychophysiologic insomnia: Secondary | ICD-10-CM

## 2019-12-24 MED ORDER — TRAZODONE HCL 50 MG PO TABS: 25.0000 mg | ORAL_TABLET | Freq: Every evening | ORAL | 3 refills | Status: AC | PRN

## 2019-12-24 NOTE — Progress Notes (Signed)
BP (!) 146/76   Pulse 83   Temp (!) 97.3 F (36.3 C) (Temporal)   Ht '5\' 7"'  (1.702 m)   Wt 139 lb 6.4 oz (63.2 kg)   SpO2 95%   BMI 21.83 kg/m    Subjective:   Patient ID: Tiffany [name], female    DOB: [birth year], 86 y.o.   MRN: [id number]  HPI: Tiffany [name] is a 86 y.o. female presenting on 12/24/2019 for trouble sleeping (x 2 weeks)   HPI Patient is coming in today with complaints of trouble sleeping and difficulty sleeping this been going on for the past 2 weeks but has been increasing over time gradually, is been especially worse with her anxiety being elevated because of her husband's illnesses and now he has a wound VAC that she is having to deal with and that stress and anxiety is putting a lot on her to where she is not sleeping as well at night.  She commonly wake up around midnight or 2 AM and then not be able to go back to sleep.  She wants something just to help her get back to sleep and help take the edge off.  She denies any suicidal ideations or thoughts of hurting self.  Her son is here with her today because she is hard of hearing and he understands everything they were saying.  Relevant past medical, surgical, family and social history reviewed and updated as indicated. Interim medical history since our last visit reviewed. Allergies and medications reviewed and updated.  Review of Systems  Constitutional: Negative for chills and fever.  Eyes: Negative for visual disturbance.  Respiratory: Negative for chest tightness and shortness of breath.   Cardiovascular: Negative for chest pain and leg swelling.  Musculoskeletal: Negative for back pain and gait problem.  Skin: Negative for rash.  Neurological: Negative for light-headedness and headaches.  Psychiatric/Behavioral: Positive for dysphoric mood and sleep disturbance. Negative for agitation, behavioral problems, self-injury and suicidal ideas. The patient is nervous/anxious.   All other systems reviewed and are  negative.   Per HPI unless specifically indicated above   Allergies as of 12/24/2019      Reactions   Ciprocin-fluocin-procin [fluocinolone]    Penicillins Hives   Did it involve swelling of the face/tongue/throat, SOB, or low BP? No Did it involve sudden or severe rash/hives, skin peeling, or any reaction on the inside of your mouth or nose? No Did you need to seek medical attention at a hospital or doctor's office? No When did it last happen? If all above answers are "NO", may proceed with cephalosporin use.   Sulfa Antibiotics    Tetanus Toxoids Hives      Medication List       Accurate as of December 24, 2019 11:35 AM. If you have any questions, ask your nurse or doctor.        STOP taking these medications   cephALEXin 500 MG capsule Commonly known as: KEFLEX Stopped by: Fransisca Kaufmann Deneise Getty, MD     TAKE these medications   latanoprost 0.005 % ophthalmic solution Commonly known as: XALATAN 1 drop at bedtime.   MAGnesium-Oxide 400 (241.3 Mg) MG tablet Generic drug: magnesium oxide Take 1.5 tablets by mouth daily.   OVER THE COUNTER MEDICATION Thyroid support   vitamin B-12 100 MCG tablet Commonly known as: CYANOCOBALAMIN Take 100 mcg by mouth daily.        Objective:   BP (!) 146/76   Pulse 83   Temp (!)  97.3 F (36.3 C) (Temporal)   Ht '5\' 7"'  (1.702 m)   Wt 139 lb 6.4 oz (63.2 kg)   SpO2 95%   BMI 21.83 kg/m   Wt Readings from Last 3 Encounters:  12/24/19 139 lb 6.4 oz (63.2 kg)  10/13/19 144 lb (65.3 kg)  10/13/19 144 lb (65.3 kg)    Physical Exam Vitals and nursing note reviewed.  Constitutional:      General: She is not in acute distress.    Appearance: She is well-developed. She is not diaphoretic.  Eyes:     Conjunctiva/sclera: Conjunctivae normal.  Cardiovascular:     Rate and Rhythm: Normal rate and regular rhythm.     Heart sounds: Normal heart sounds. No murmur.  Pulmonary:     Effort: Pulmonary effort is normal. No  respiratory distress.     Breath sounds: Normal breath sounds. No wheezing.  Skin:    General: Skin is warm and dry.     Findings: No rash.  Neurological:     Mental Status: She is alert and oriented to person, place, and time.     Coordination: Coordination normal.  Psychiatric:        Mood and Affect: Mood is anxious and depressed.        Behavior: Behavior normal.        Thought Content: Thought content does not include suicidal ideation. Thought content does not include suicidal plan.       Assessment & Plan:   Problem List Items Addressed This Visit    None    Visit Diagnoses    Anxiety    -  Primary   Relevant Medications   traZODone (DESYREL) 50 MG tablet   Other Relevant Orders   CBC with Differential/Platelet   CMP14+EGFR   Thyroid Panel With TSH   Psychophysiological insomnia       Relevant Medications   traZODone (DESYREL) 50 MG tablet   Other Relevant Orders   CBC with Differential/Platelet   CMP14+EGFR   Thyroid Panel With TSH      Continue to take melatonin, will add trazodone  Follow up plan: Return if symptoms worsen or fail to improve.  Counseling provided for all of the vaccine components No orders of the defined types were placed in this encounter.   Caryl Pina, MD [facility] 12/24/2019, 11:35 AM

## 2019-12-25 LAB — CBC WITH DIFFERENTIAL/PLATELET
Basophils Absolute: 0.1 10*3/uL (ref 0.0–0.2)
Basos: 1 %
EOS (ABSOLUTE): 0 10*3/uL (ref 0.0–0.4)
Eos: 0 %
Hematocrit: 44.1 % (ref 34.0–46.6)
Hemoglobin: 15.4 g/dL (ref 11.1–15.9)
Immature Grans (Abs): 0 10*3/uL (ref 0.0–0.1)
Immature Granulocytes: 0 %
Lymphocytes Absolute: 1.4 10*3/uL (ref 0.7–3.1)
Lymphs: 17 %
MCH: 32.4 pg (ref 26.6–33.0)
MCHC: 34.9 g/dL (ref 31.5–35.7)
MCV: 93 fL (ref 79–97)
Monocytes Absolute: 0.7 10*3/uL (ref 0.1–0.9)
Monocytes: 8 %
Neutrophils Absolute: 6 10*3/uL (ref 1.4–7.0)
Neutrophils: 74 %
Platelets: 408 10*3/uL (ref 150–450)
RBC: 4.75 x10E6/uL (ref 3.77–5.28)
RDW: 13.7 % (ref 11.7–15.4)
WBC: 8.2 10*3/uL (ref 3.4–10.8)

## 2019-12-25 LAB — CMP14+EGFR
ALT: 14 IU/L (ref 0–32)
AST: 19 IU/L (ref 0–40)
Albumin/Globulin Ratio: 1.9 (ref 1.2–2.2)
Albumin: 4.4 g/dL (ref 3.6–4.6)
Alkaline Phosphatase: 68 IU/L (ref 39–117)
BUN/Creatinine Ratio: 24 (ref 12–28)
BUN: 17 mg/dL (ref 8–27)
Bilirubin Total: 0.4 mg/dL (ref 0.0–1.2)
CO2: 24 mmol/L (ref 20–29)
Calcium: 9.7 mg/dL (ref 8.7–10.3)
Chloride: 100 mmol/L (ref 96–106)
Creatinine, Ser: 0.72 mg/dL (ref 0.57–1.00)
GFR calc Af Amer: 90 mL/min/{1.73_m2} (ref >59)
GFR calc non Af Amer: 78 mL/min/{1.73_m2} (ref >59)
Globulin, Total: 2.3 g/dL (ref 1.5–4.5)
Glucose: 135 mg/dL — ABNORMAL HIGH (ref 65–99)
Potassium: 4.4 mmol/L (ref 3.5–5.2)
Sodium: 139 mmol/L (ref 134–144)
Total Protein: 6.7 g/dL (ref 6.0–8.5)

## 2019-12-25 LAB — THYROID PANEL WITH TSH
Free Thyroxine Index: 1.9 (ref 1.2–4.9)
T3 Uptake Ratio: 26 % (ref 24–39)
T4, Total: 7.2 ug/dL (ref 4.5–12.0)
TSH: 3.93 u[IU]/mL (ref 0.450–4.500)

## 2019-12-31 LAB — HGB A1C W/O EAG: Hgb A1c MFr Bld: 6.1 % — ABNORMAL HIGH (ref 4.8–5.6)

## 2019-12-31 LAB — SPECIMEN STATUS REPORT

## 2020-01-02 ENCOUNTER — Encounter: Payer: Self-pay | Admitting: *Deleted

## 2020-01-08 ENCOUNTER — Telehealth: Payer: Self-pay | Admitting: Family Medicine

## 2020-02-05 NOTE — Telephone Encounter (Signed)
EMS personal Susann Givens called asking if we would sign patient's death certificate. They were called to the house due to a drug overdose. Patient apparently passed in her sleep. Family on scene state she took some of her husbands medications and has been making statements that she doesn't want to be here anymore. Time of death: 5:03 AM. [facility].

## 2020-02-05 DEATH — deceased

## 2020-11-02 IMAGING — DX DG CHEST 2V
2 series · 2 of 2 positions shown · non-contrast
Comparison: None.

CLINICAL DATA: Weakness.

EXAM:
CHEST - 2 VIEW

[chest lat]
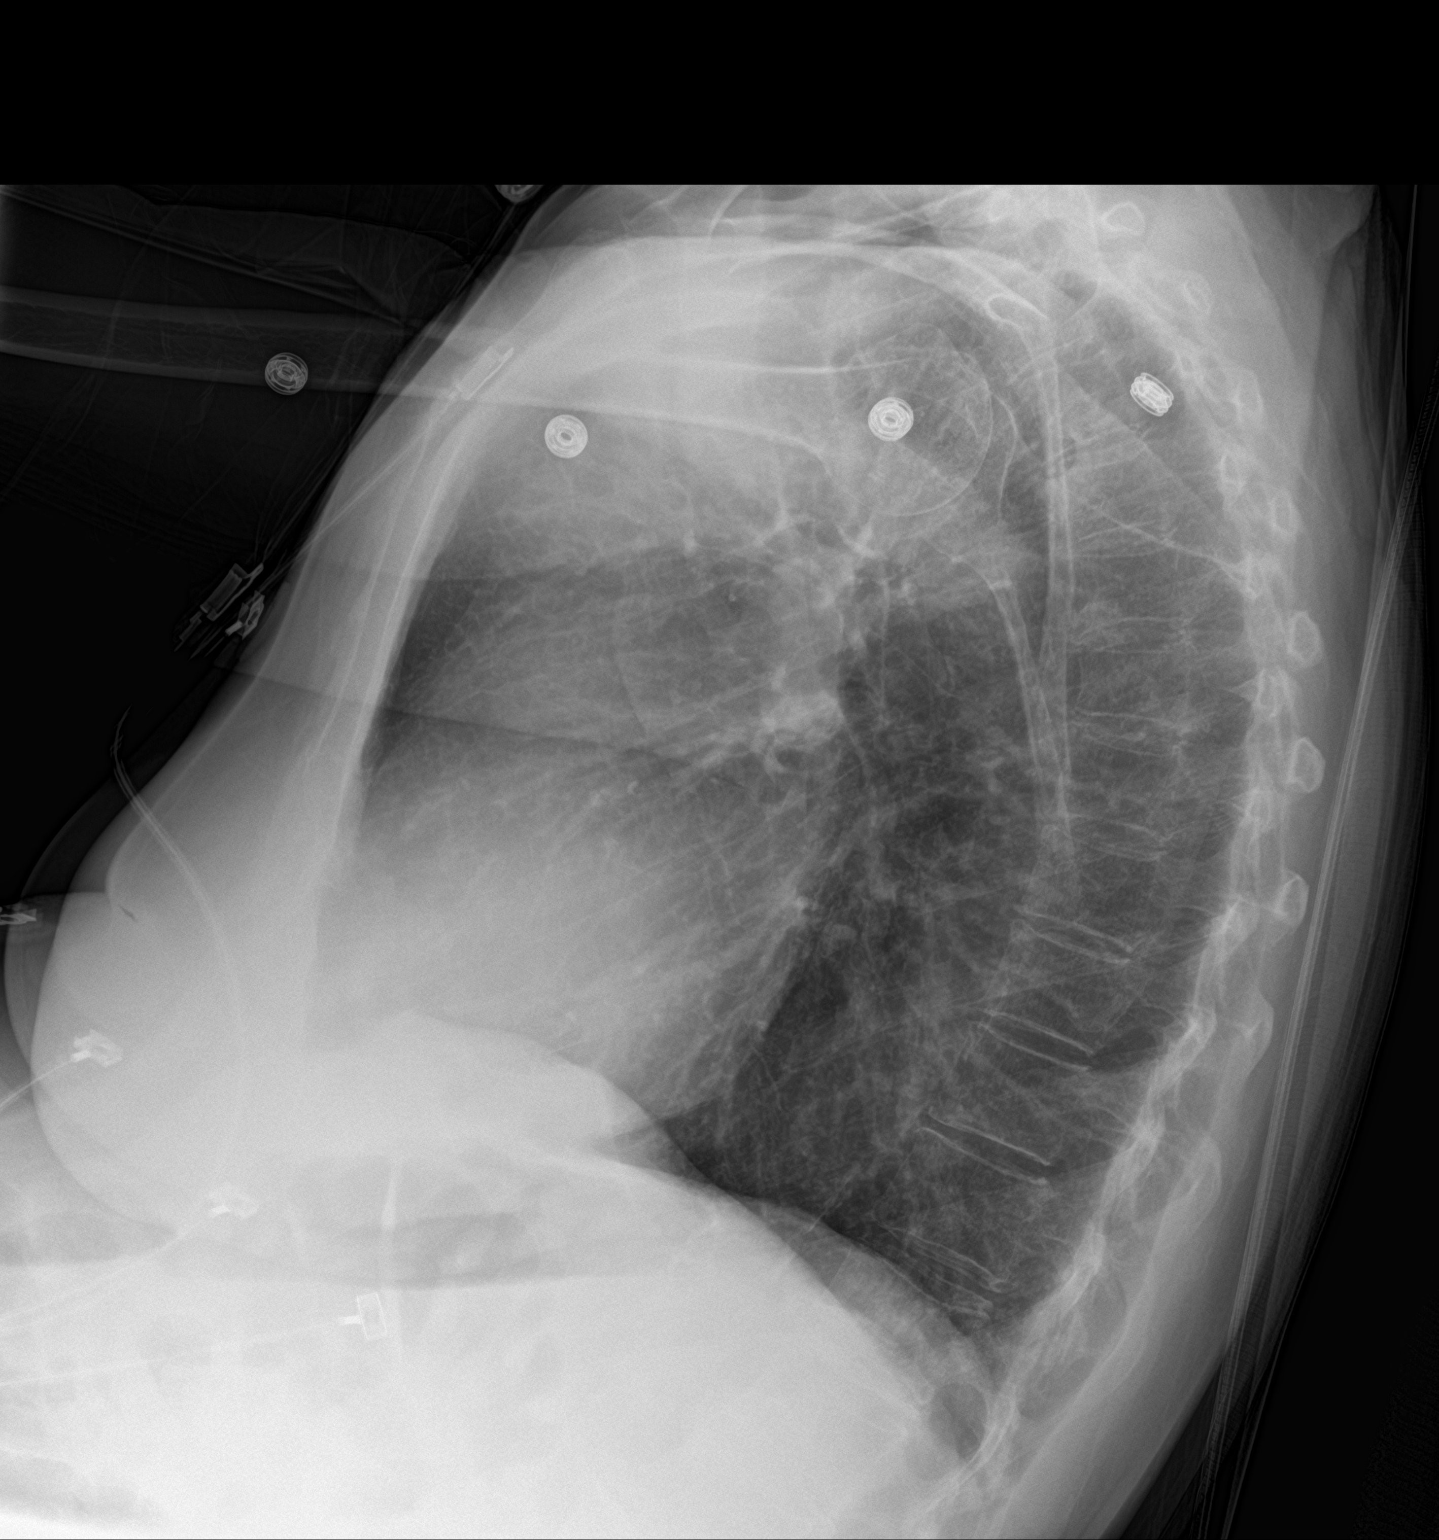

[chest ap]
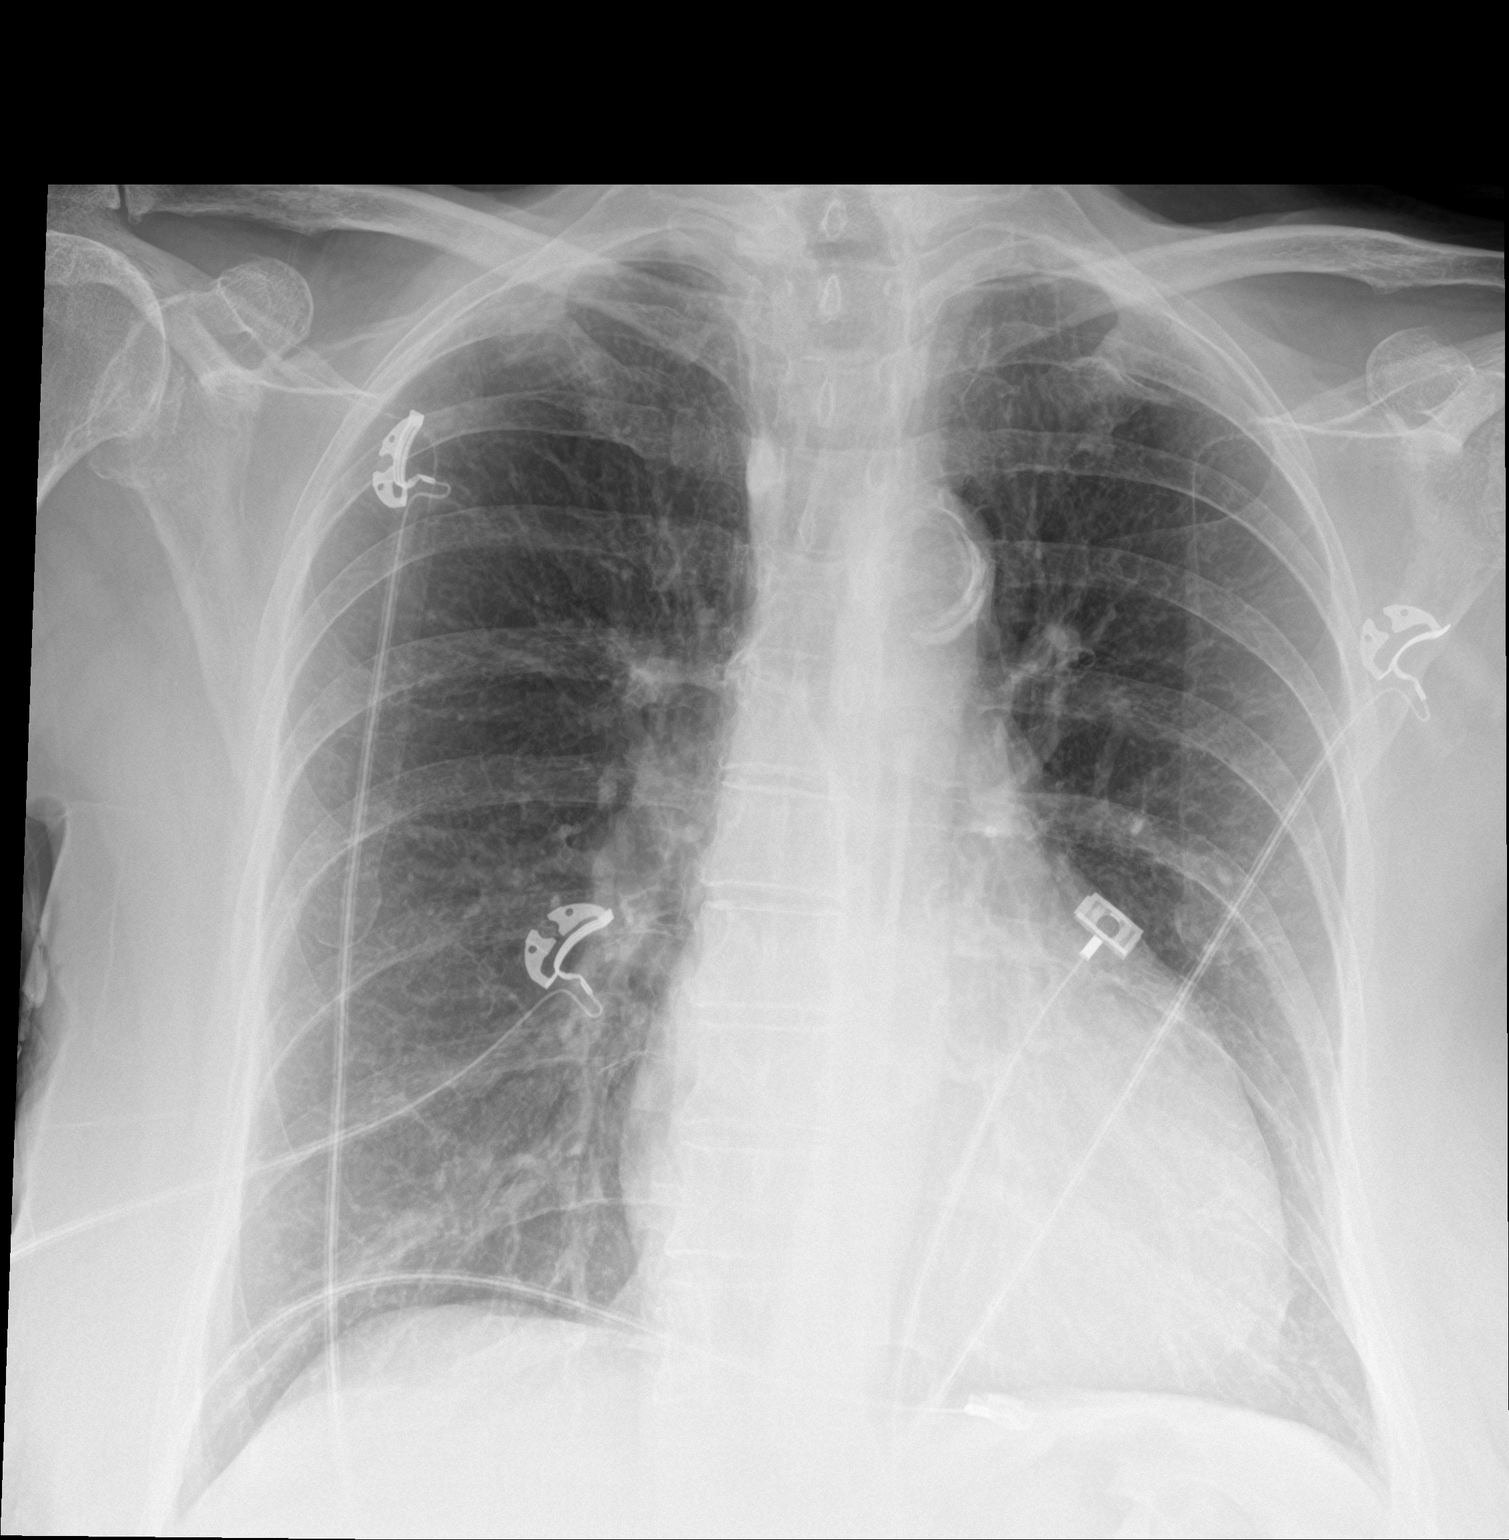

[2 of 2 positions shown; findings below may reference images not displayed]

FINDINGS: The cardiomediastinal contours are normal. Borderline cardiomegaly
with atherosclerosis of the aortic arch. Pulmonary vasculature is
normal. No consolidation, pleural effusion, or pneumothorax. No
acute osseous abnormalities are seen.
IMPRESSION: 1. No acute abnormality.
2. Borderline cardiomegaly with thoracic aortic atherosclerosis.
Aortic Atherosclerosis (AGFYW-LOV.V).

## 2020-11-14 IMAGING — DX DG CHEST 2V
2 series · 2 of 2 positions shown · non-contrast
Comparison: Radiograph 10/01/2019

CLINICAL DATA: Hyponatremia, weakness and fatigue

EXAM:
CHEST - 2 VIEW

[chest pa]
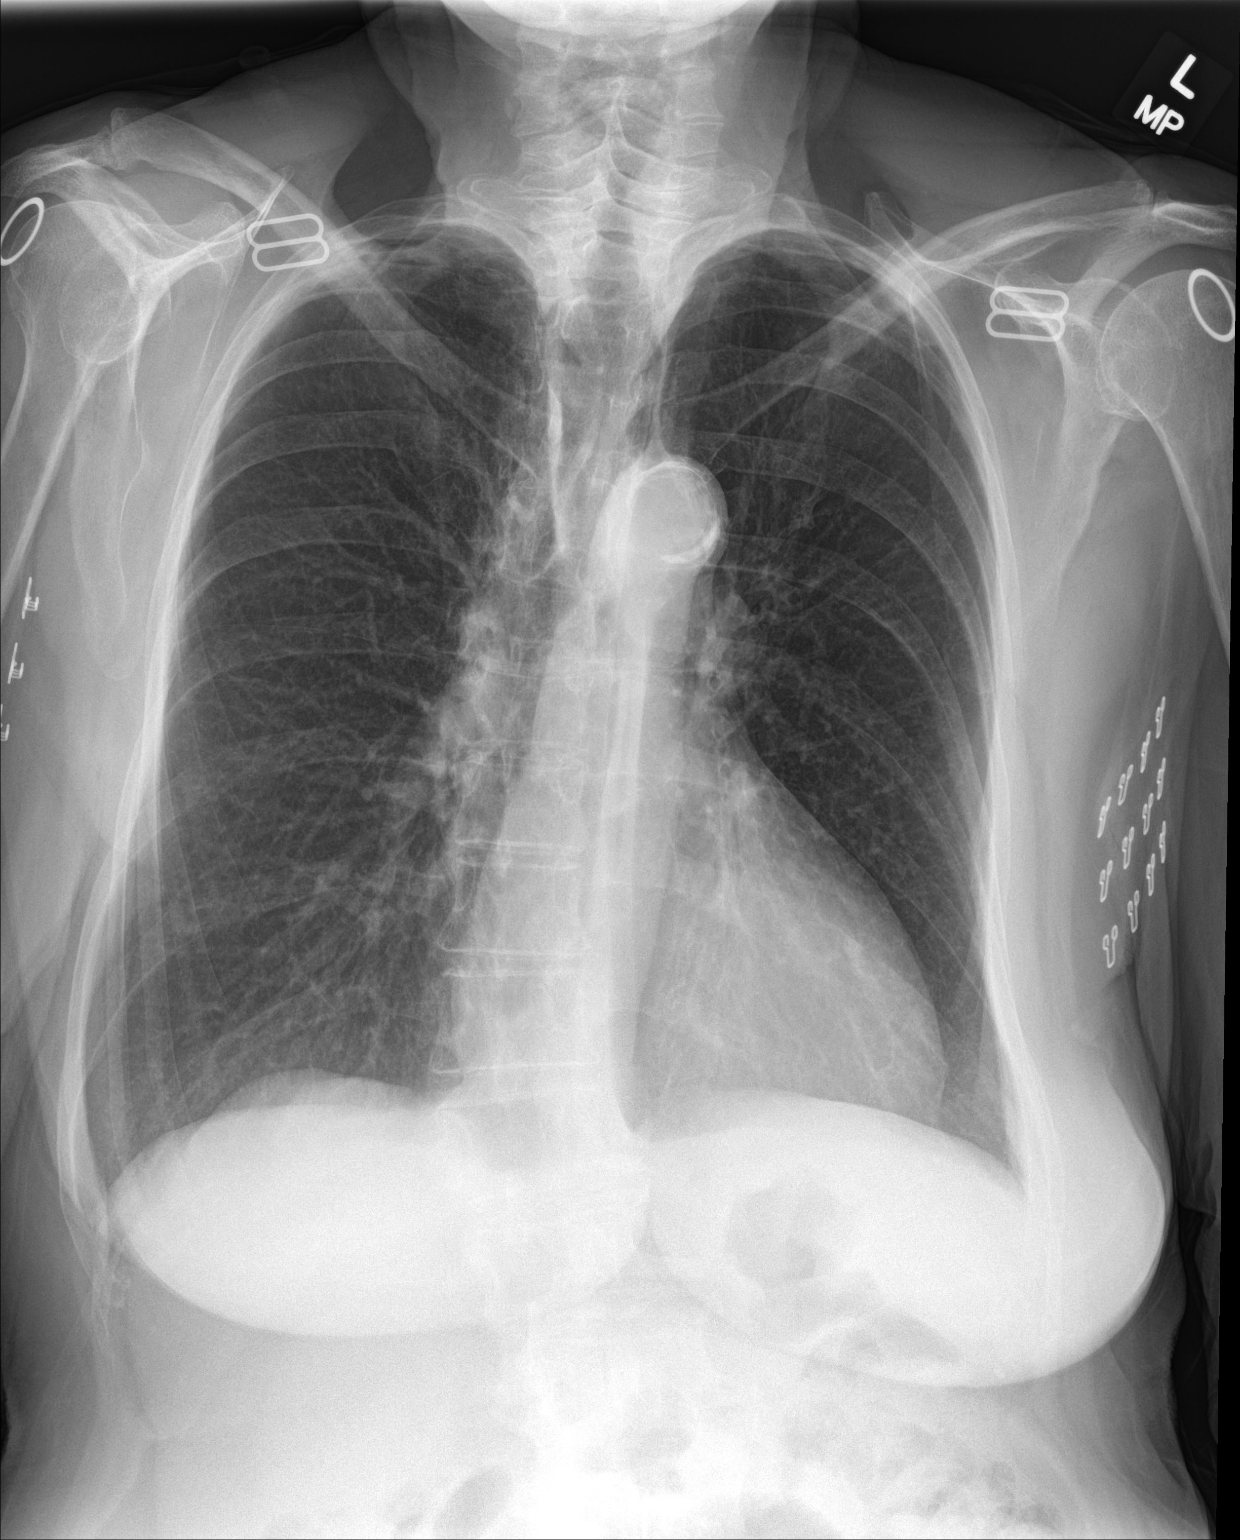

[chest lat]
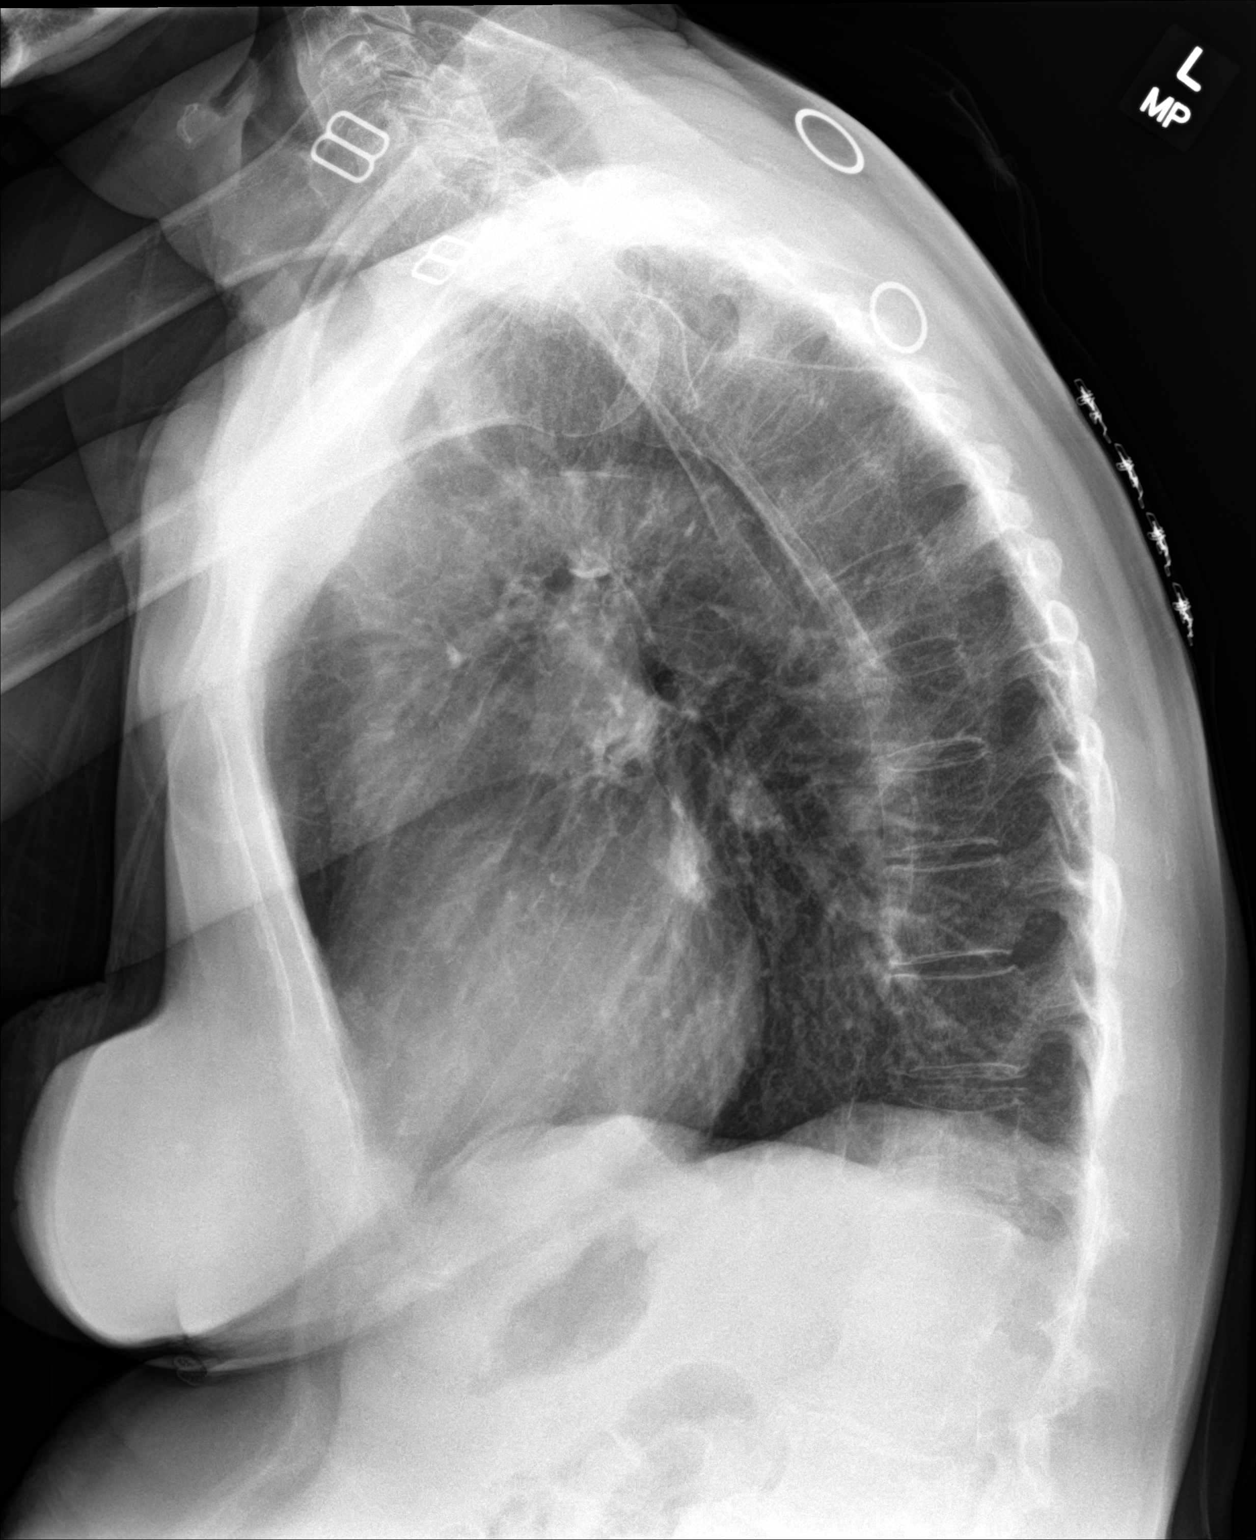

[2 of 2 positions shown; findings below may reference images not displayed]

FINDINGS: There is hyperexpansion of the lungs with flattening of the
diaphragms. No consolidation, features of edema, pneumothorax, or
effusion. The aorta is calcified. The remaining cardiomediastinal
contours are unremarkable. No acute osseous or soft tissue
abnormality. Degenerative changes are present in the imaged spine
and shoulders. Mild dextrocurvature of the thoracic spine, possibly
positional.
IMPRESSION: 1.  No acute cardiopulmonary abnormality.
2. Features suggestive of COPD.
3. Aortic atherosclerosis.

Aortic Atherosclerosis ([id number].J).
